# Patient Record
Sex: Female | Born: 1970 | Race: White | Hispanic: Yes | Marital: Married | Smoking: Never smoker
Health system: Southern US, Community
[De-identification: ages and names within clinical notes are randomized; demographics above are authoritative.]

## PROBLEM LIST (undated history)

## (undated) ENCOUNTER — Inpatient Hospital Stay (HOSPITAL_COMMUNITY): Payer: Self-pay

## (undated) DIAGNOSIS — N898 Other specified noninflammatory disorders of vagina: Secondary | ICD-10-CM

## (undated) DIAGNOSIS — J40 Bronchitis, not specified as acute or chronic: Secondary | ICD-10-CM

## (undated) DIAGNOSIS — T7840XA Allergy, unspecified, initial encounter: Secondary | ICD-10-CM

## (undated) DIAGNOSIS — E785 Hyperlipidemia, unspecified: Secondary | ICD-10-CM

## (undated) DIAGNOSIS — L719 Rosacea, unspecified: Secondary | ICD-10-CM

## (undated) DIAGNOSIS — K219 Gastro-esophageal reflux disease without esophagitis: Secondary | ICD-10-CM

## (undated) DIAGNOSIS — J45909 Unspecified asthma, uncomplicated: Secondary | ICD-10-CM

## (undated) DIAGNOSIS — E119 Type 2 diabetes mellitus without complications: Secondary | ICD-10-CM

## (undated) HISTORY — DX: Allergy, unspecified, initial encounter: T78.40XA

## (undated) HISTORY — DX: Hyperlipidemia, unspecified: E78.5

## (undated) HISTORY — PX: DILATION AND CURETTAGE OF UTERUS: SHX78

## (undated) HISTORY — DX: Gastro-esophageal reflux disease without esophagitis: K21.9

## (undated) HISTORY — DX: Type 2 diabetes mellitus without complications: E11.9

---
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
## 1999-10-10 ENCOUNTER — Encounter: Admission: RE | Admit: 1999-10-10 | Discharge: 1999-10-10 | Payer: Self-pay | Admitting: Obstetrics

## 1999-10-17 ENCOUNTER — Encounter: Admission: RE | Admit: 1999-10-17 | Discharge: 1999-10-17 | Payer: Self-pay | Admitting: Obstetrics

## 1999-11-04 ENCOUNTER — Ambulatory Visit (HOSPITAL_COMMUNITY): Admission: RE | Admit: 1999-11-04 | Discharge: 1999-11-04 | Payer: Self-pay | Admitting: Obstetrics & Gynecology

## 1999-11-14 ENCOUNTER — Encounter: Admission: RE | Admit: 1999-11-14 | Discharge: 1999-11-14 | Payer: Self-pay | Admitting: Obstetrics

## 1999-11-27 ENCOUNTER — Encounter: Admission: RE | Admit: 1999-11-27 | Discharge: 1999-11-27 | Payer: Self-pay | Admitting: Obstetrics

## 1999-12-25 ENCOUNTER — Encounter: Admission: RE | Admit: 1999-12-25 | Discharge: 1999-12-25 | Payer: Self-pay | Admitting: Obstetrics & Gynecology

## 2000-01-08 ENCOUNTER — Encounter: Admission: RE | Admit: 2000-01-08 | Discharge: 2000-01-08 | Payer: Self-pay | Admitting: Obstetrics & Gynecology

## 2000-01-22 ENCOUNTER — Encounter: Admission: RE | Admit: 2000-01-22 | Discharge: 2000-01-22 | Payer: Self-pay | Admitting: Obstetrics & Gynecology

## 2000-01-25 ENCOUNTER — Inpatient Hospital Stay (HOSPITAL_COMMUNITY): Admission: AD | Admit: 2000-01-25 | Discharge: 2000-01-28 | Payer: Self-pay | Admitting: *Deleted

## 2000-01-27 ENCOUNTER — Encounter: Payer: Self-pay | Admitting: *Deleted

## 2000-01-29 ENCOUNTER — Encounter: Admission: RE | Admit: 2000-01-29 | Discharge: 2000-01-29 | Payer: Self-pay | Admitting: Obstetrics & Gynecology

## 2000-02-12 ENCOUNTER — Encounter: Admission: RE | Admit: 2000-02-12 | Discharge: 2000-02-12 | Payer: Self-pay | Admitting: Obstetrics & Gynecology

## 2000-02-19 ENCOUNTER — Encounter: Admission: RE | Admit: 2000-02-19 | Discharge: 2000-02-19 | Payer: Self-pay | Admitting: Obstetrics & Gynecology

## 2000-02-26 ENCOUNTER — Encounter: Admission: RE | Admit: 2000-02-26 | Discharge: 2000-02-26 | Payer: Self-pay | Admitting: Obstetrics & Gynecology

## 2000-03-04 ENCOUNTER — Encounter: Admission: RE | Admit: 2000-03-04 | Discharge: 2000-03-04 | Payer: Self-pay | Admitting: Obstetrics & Gynecology

## 2000-03-11 ENCOUNTER — Encounter: Admission: RE | Admit: 2000-03-11 | Discharge: 2000-03-11 | Payer: Self-pay | Admitting: Obstetrics & Gynecology

## 2000-03-17 ENCOUNTER — Inpatient Hospital Stay (HOSPITAL_COMMUNITY): Admission: AD | Admit: 2000-03-17 | Discharge: 2000-03-19 | Payer: Self-pay | Admitting: *Deleted

## 2000-05-01 ENCOUNTER — Inpatient Hospital Stay (HOSPITAL_COMMUNITY): Admission: AD | Admit: 2000-05-01 | Discharge: 2000-05-01 | Payer: Self-pay | Admitting: *Deleted

## 2002-02-21 ENCOUNTER — Ambulatory Visit (HOSPITAL_COMMUNITY): Admission: RE | Admit: 2002-02-21 | Discharge: 2002-02-21 | Payer: Self-pay | Admitting: Obstetrics and Gynecology

## 2002-07-19 ENCOUNTER — Inpatient Hospital Stay (HOSPITAL_COMMUNITY): Admission: AD | Admit: 2002-07-19 | Discharge: 2002-07-22 | Payer: Self-pay | Admitting: Obstetrics and Gynecology

## 2004-05-30 ENCOUNTER — Emergency Department (HOSPITAL_COMMUNITY): Admission: EM | Admit: 2004-05-30 | Discharge: 2004-05-31 | Payer: Self-pay | Admitting: Emergency Medicine

## 2004-06-04 ENCOUNTER — Inpatient Hospital Stay (HOSPITAL_COMMUNITY): Admission: AD | Admit: 2004-06-04 | Discharge: 2004-06-04 | Payer: Self-pay | Admitting: *Deleted

## 2004-06-04 ENCOUNTER — Ambulatory Visit: Payer: Self-pay | Admitting: Obstetrics & Gynecology

## 2004-06-06 ENCOUNTER — Inpatient Hospital Stay (HOSPITAL_COMMUNITY): Admission: AD | Admit: 2004-06-06 | Discharge: 2004-06-06 | Payer: Self-pay | Admitting: Obstetrics & Gynecology

## 2004-06-10 ENCOUNTER — Inpatient Hospital Stay (HOSPITAL_COMMUNITY): Admission: AD | Admit: 2004-06-10 | Discharge: 2004-06-10 | Payer: Self-pay | Admitting: Obstetrics & Gynecology

## 2004-06-13 ENCOUNTER — Ambulatory Visit (HOSPITAL_COMMUNITY): Admission: RE | Admit: 2004-06-13 | Discharge: 2004-06-13 | Payer: Self-pay | Admitting: Obstetrics & Gynecology

## 2004-06-13 ENCOUNTER — Encounter (INDEPENDENT_AMBULATORY_CARE_PROVIDER_SITE_OTHER): Payer: Self-pay | Admitting: *Deleted

## 2004-06-13 ENCOUNTER — Ambulatory Visit: Payer: Self-pay | Admitting: Obstetrics & Gynecology

## 2004-06-20 ENCOUNTER — Ambulatory Visit: Payer: Self-pay | Admitting: Family Medicine

## 2005-01-19 ENCOUNTER — Emergency Department (HOSPITAL_COMMUNITY): Admission: EM | Admit: 2005-01-19 | Discharge: 2005-01-19 | Payer: Self-pay | Admitting: Emergency Medicine

## 2005-01-27 ENCOUNTER — Ambulatory Visit: Payer: Self-pay | Admitting: Nurse Practitioner

## 2005-02-04 ENCOUNTER — Ambulatory Visit: Payer: Self-pay | Admitting: *Deleted

## 2005-02-11 ENCOUNTER — Ambulatory Visit: Payer: Self-pay | Admitting: Nurse Practitioner

## 2005-03-04 ENCOUNTER — Ambulatory Visit: Payer: Self-pay | Admitting: Nurse Practitioner

## 2005-03-12 ENCOUNTER — Ambulatory Visit: Payer: Self-pay | Admitting: Nurse Practitioner

## 2005-03-26 ENCOUNTER — Other Ambulatory Visit: Admission: RE | Admit: 2005-03-26 | Discharge: 2005-03-26 | Payer: Self-pay | Admitting: Family Medicine

## 2005-03-26 ENCOUNTER — Ambulatory Visit: Payer: Self-pay | Admitting: Nurse Practitioner

## 2005-03-26 ENCOUNTER — Encounter (INDEPENDENT_AMBULATORY_CARE_PROVIDER_SITE_OTHER): Payer: Self-pay | Admitting: Specialist

## 2007-05-17 ENCOUNTER — Ambulatory Visit: Payer: Self-pay | Admitting: Internal Medicine

## 2007-05-17 LAB — CONVERTED CEMR LAB
ALT: 21 units/L (ref 0–35)
Albumin: 4.3 g/dL (ref 3.5–5.2)
Basophils Absolute: 0 10*3/uL (ref 0.0–0.1)
CO2: 22 meq/L (ref 19–32)
Calcium: 9.4 mg/dL (ref 8.4–10.5)
Chloride: 104 meq/L (ref 96–112)
Eosinophils Relative: 3 % (ref 0–5)
Glucose, Bld: 110 mg/dL — ABNORMAL HIGH (ref 70–99)
HCT: 41.8 % (ref 36.0–46.0)
Hemoglobin: 14.2 g/dL (ref 12.0–15.0)
Lymphocytes Relative: 32 % (ref 12–46)
Lymphs Abs: 2.2 10*3/uL (ref 0.7–4.0)
Neutro Abs: 4 10*3/uL (ref 1.7–7.7)
Platelets: 259 10*3/uL (ref 150–400)
RDW: 12.8 % (ref 11.5–15.5)
Sodium: 139 meq/L (ref 135–145)
TSH: 2.023 microintl units/mL (ref 0.350–5.50)
Total Protein: 7.6 g/dL (ref 6.0–8.3)
WBC: 7 10*3/uL (ref 4.0–10.5)

## 2007-06-28 ENCOUNTER — Ambulatory Visit: Payer: Self-pay | Admitting: Family Medicine

## 2007-07-05 ENCOUNTER — Ambulatory Visit: Payer: Self-pay | Admitting: Internal Medicine

## 2007-07-20 ENCOUNTER — Ambulatory Visit: Payer: Self-pay | Admitting: Internal Medicine

## 2007-07-26 ENCOUNTER — Ambulatory Visit: Payer: Self-pay | Admitting: Internal Medicine

## 2007-09-21 ENCOUNTER — Ambulatory Visit: Payer: Self-pay | Admitting: Internal Medicine

## 2008-03-07 ENCOUNTER — Ambulatory Visit: Payer: Self-pay | Admitting: Family Medicine

## 2008-03-15 ENCOUNTER — Ambulatory Visit: Payer: Self-pay | Admitting: Internal Medicine

## 2008-03-22 ENCOUNTER — Ambulatory Visit: Payer: Self-pay | Admitting: Internal Medicine

## 2008-03-22 LAB — CONVERTED CEMR LAB
Anti Nuclear Antibody(ANA): NEGATIVE
BUN: 15 mg/dL (ref 6–23)
Basophils Absolute: 0 10*3/uL (ref 0.0–0.1)
Basophils Relative: 0 % (ref 0–1)
Lymphocytes Relative: 45 % (ref 12–46)
MCHC: 33.2 g/dL (ref 30.0–36.0)
Monocytes Absolute: 0.4 10*3/uL (ref 0.1–1.0)
Neutro Abs: 2 10*3/uL (ref 1.7–7.7)
Neutrophils Relative %: 43 % (ref 43–77)
Platelets: 247 10*3/uL (ref 150–400)
Potassium: 4.3 meq/L (ref 3.5–5.3)
RDW: 12.6 % (ref 11.5–15.5)
Sed Rate: 12 mm/hr (ref 0–22)
Sodium: 140 meq/L (ref 135–145)

## 2008-03-31 ENCOUNTER — Ambulatory Visit: Payer: Self-pay | Admitting: Internal Medicine

## 2008-03-31 ENCOUNTER — Encounter: Payer: Self-pay | Admitting: Family Medicine

## 2008-03-31 LAB — CONVERTED CEMR LAB
Chlamydia, DNA Probe: NEGATIVE
GC Probe Amp, Genital: NEGATIVE

## 2008-04-01 ENCOUNTER — Encounter: Payer: Self-pay | Admitting: Family Medicine

## 2008-04-10 ENCOUNTER — Ambulatory Visit: Payer: Self-pay | Admitting: Internal Medicine

## 2008-06-26 ENCOUNTER — Ambulatory Visit: Payer: Self-pay | Admitting: Internal Medicine

## 2008-07-03 ENCOUNTER — Ambulatory Visit: Payer: Self-pay | Admitting: Internal Medicine

## 2008-07-11 ENCOUNTER — Encounter: Payer: Self-pay | Admitting: Family Medicine

## 2008-07-11 ENCOUNTER — Ambulatory Visit: Payer: Self-pay | Admitting: Internal Medicine

## 2008-07-11 LAB — CONVERTED CEMR LAB
ALT: 14 units/L (ref 0–35)
AST: 17 units/L (ref 0–37)
Albumin: 4.1 g/dL (ref 3.5–5.2)
Alkaline Phosphatase: 61 units/L (ref 39–117)
BUN: 15 mg/dL (ref 6–23)
Creatinine, Ser: 0.67 mg/dL (ref 0.40–1.20)
Potassium: 4 meq/L (ref 3.5–5.3)

## 2008-07-17 ENCOUNTER — Ambulatory Visit: Payer: Self-pay | Admitting: Internal Medicine

## 2008-08-28 ENCOUNTER — Ambulatory Visit: Payer: Self-pay | Admitting: Internal Medicine

## 2009-09-11 ENCOUNTER — Ambulatory Visit: Payer: Self-pay | Admitting: Internal Medicine

## 2009-12-05 ENCOUNTER — Ambulatory Visit: Payer: Self-pay | Admitting: Internal Medicine

## 2009-12-20 ENCOUNTER — Ambulatory Visit: Payer: Self-pay | Admitting: Internal Medicine

## 2009-12-21 ENCOUNTER — Encounter (INDEPENDENT_AMBULATORY_CARE_PROVIDER_SITE_OTHER): Payer: Self-pay | Admitting: Internal Medicine

## 2010-10-04 NOTE — Op Note (Signed)
NAME:  Tiffany Vazquez, Tiffany Vazquez                         ACCOUNT NO.:  [id number]   MEDICAL RECORD NO.:  [id number]                   PATIENT TYPE:  INP   LOCATION:  9199                                 FACILITY:  [facility]   PHYSICIAN:  Mary Sella. Orlene Erm, M.D.                 DATE OF BIRTH:  [birth year]   DATE OF PROCEDURE:  07/19/2002  DATE OF DISCHARGE:                                 OPERATIVE REPORT   PREOPERATIVE DIAGNOSES:  A 40 year old gravida 3, para 2 at term with a  breech presentation in active labor.   POSTOPERATIVE DIAGNOSES:  A 40 year old gravida 3, para 2 at term with a  breech presentation in active labor.   PROCEDURE:  Repeat low transverse cesarean section.   SURGEON:  Mary Sella. Orlene Erm, M.D.   ASSISTANT:  Caren Griffins, C.N.M.   ANESTHESIA:  Spinal.   COMPLICATIONS:  None.   SPECIMENS:  None.   ESTIMATED BLOOD LOSS:  1000 mL.   FINDINGS:  Viable female infant delivered at term from breech presentation.  Dense omental adhesions to the anterior uterus.   DISPOSITION:  Recovery room, stable.   INDICATIONS FOR PROCEDURE:  The patient is a 40 year old gravida 3, para 2  who had a previous cesarean section for breech presentation.  Presented to  [facility] Clinic in active labor dilated 4 cm.  She was admitted from maternity  admissions and found to be 4-5 cm.  The patient was counseled on the risk of  cesarean section including risk of bleeding, infection, injury to internal  organs, risk of transfusion, or emergent hysterectomy.  The patient was  counseled on these via interpreter and acknowledges understanding.   PROCEDURE:  The patient was taken to operating room.  She was given spinal  anesthesia.  She was prepped and draped in a sterile fashion.  A  Pfannenstiel incision was performed and carried down to the underlying  fascia with a scalpel.  The fascia was entered sharply and dissected  laterally with Mayo scissors.  The fascia was separated from the underlying  rectus  muscle bellies with sharp and blunt dissection.  The peritoneum was  identified and entered sharply.  The peritoneum was dissected both  superiorly and inferiorly.  A bladder blade was placed.  There was noted to  be dense omental adhesions over the anterior surface of the uterus.  These  were dissected with the Bovie in sharp dissection to get this out of the  operative field.  The bladder flap was created with sharp and blunt  dissection.  The uterus was scored with a scalpel and the incision was  carried down to the uterine cavity.  The amniotic membranes were ruptured  and the fluid was clear.  The infant's feet were grasped and delivered  through the uterine incision.  The infant's body was delivered and the  infant's right arm was delivered.  The infant was rotated  180 degrees and  the infant's left arm was delivered.  The infant's head was flexed and  delivered easily through the uterine incision.  The cord was clamped and cut  and the infant was handed off to the awaiting neonatal resuscitation team.  The uterus was massaged and the placenta was extracted.  The uterus was  curetted with a dry lap sponge.  The uterus was externalized and the  incision was closed in a running locking stitch of 0 chromic.  A second  imbricating suture was performed.  Several figure-of-eight sutures were used  to obtain final hemostasis.  There was multiple sites of oozing from the  omentum and these were cauterized.  There was one large vessel which had  been previously clamped and was tied with 2-0 Vicryl.  These were all  hemostatic.  The uterus was visualized again after it had been returned to  the abdominal cavity and noted to be hemostatic.  The omental dissection was  visualized as well and noted to be hemostatic.  The fascia was then closed  with a running stitch of 0 Vicryl.  The skin and subcutaneous tissues were  irrigated with copious amounts of saline and the skin edges were   reapproximated with staples.  The patient tolerated the procedure well and  returned to the recovery room in stable condition.  Sponge, instrument, and  needle counts were correct at the end of procedure.                                               Mary Sella. Orlene Erm, M.D.    EMH/MEDQ  D:  07/19/2002  T:  07/19/2002  Job:  [id number]

## 2010-10-04 NOTE — Discharge Summary (Signed)
   NAME:  Tiffany Vazquez, Tiffany Vazquez                         ACCOUNT NO.:  [id number]   MEDICAL RECORD NO.:  [id number]                   PATIENT TYPE:  INP   LOCATION:  9131                                 [facility]ILITY:  [facility]   PHYSICIAN:  Conni Elliot, M.D.             DATE OF BIRTH:  [birth year]   DATE OF ADMISSION:  07/19/2002  DATE OF DISCHARGE:  07/22/2002                                 DISCHARGE SUMMARY   HISTORY OF PRESENT ILLNESS:  The patient is a 40 year old gravida 3, para 2  at term with breech presentation who was admitted for repeat low transverse  cesarean delivery.   HOSPITAL COURSE:  The patient had a cesarean delivery on 07/19/02 without  complication.  The patient regained normal bowel and bladder function and  was ready for discharge on 07/22/02.  The patient was instructed to return in  six weeks.   DISCHARGE MEDICATIONS:  Patient was discharged on Micronor and ferrous  sulfate.                                               Conni Elliot, M.D.    ASG/MEDQ  D:  08/30/2002  T:  08/30/2002  Job:  [id number]

## 2010-10-04 NOTE — Group Therapy Note (Signed)
NAME:  Tiffany Vazquez, Tiffany Vazquez               ACCOUNT NO.:  [id number]   MEDICAL RECORD NO.:  [id number]          PATIENT TYPE:  WOC   LOCATION:  [facility]                   FACILITY:  [facility]   PHYSICIAN:  Tinnie Gens, MD        DATE OF BIRTH:  Sep 08, 1970   DATE OF SERVICE:  06/20/2004                                    CLINIC NOTE   CHIEF COMPLAINT:  Follow-up miscarriage.   HISTORY OF PRESENT ILLNESS:  The patient is a 40 year old gravida 4 para 3-0-  1-3 who is status post D&C on June 13, 2004 for a missed AB.  She states  that she has continued to have a little bit of bleeding and some moderate  lower abdominal pain that comes and goes but is daily.  She states it is  worse on the right than the left.  She denies nausea, vomiting, fevers, or  chills.  She states that she has been taking some pain medicine she was  given in the hospital; however, that makes her so sleepy and she is wanting  to go back to work.  The patient continues to have a small amount of  brownish discharge.   PHYSICAL EXAMINATION:  VITAL SIGNS:  As noted in the chart; she is afebrile.  GENERAL:  She is a well-developed, well-nourished Hispanic female.  GENITOURINARY:  She has normal external female genitalia.  The vagina is  pink and rugated.  There is a small amount of pink-tinged discharge.  The  cervix is without lesion.  The uterus is approximately 8 weeks size.  The  right and left adnexa were without mass.  The right adnexa was tender.   IMPRESSION:  1.  Postoperative visit status post D&C for missed abortion.  2.  Lower abdominal pain, questionable etiology.  The patient did have a      normal-appearing ultrasound when she was diagnosed with her AB, although      the right ovary was not well visualized; the left ovary was normal.   PLAN:  1.  I have advised ibuprofen use for recurrent pain.  The patient is to      return with fevers, chills, nausea, or vomiting.  2.  I discussed with this patient the need  to not get pregnant for the next      3 months.  3.  The patient will follow up at [facility] for contraception as well      as Pap smear.      TP/MEDQ  D:  06/20/2004  T:  06/20/2004  Job:  [id number]

## 2010-10-04 NOTE — Op Note (Signed)
NAME:  Tiffany Vazquez, Tiffany Vazquez               ACCOUNT NO.:  [id number]   MEDICAL RECORD NO.:  [id number]          PATIENT TYPE:  AMB   LOCATION:  SDC                           [facility]   PHYSICIAN:  Lesly Dukes, M.D. DATE OF BIRTH:  [birth year]   DATE OF PROCEDURE:  06/13/2004  DATE OF DISCHARGE:                                 OPERATIVE REPORT   PREOPERATIVE DIAGNOSIS:  A 40 year old para 2-0-1-2 female with missed  abortion at approximately 8 weeks estimated gestational age.   POSTOPERATIVE DIAGNOSIS:  A 40 year old para 2-0-1-2 female with missed  abortion at approximately 8 weeks estimated gestational age.   PROCEDURE:  Dilatation, vacuum curettage.   SURGEON:  Dr. Elsie Lincoln   ASSISTANT:  None.   ANESTHESIA:  MAC and local.   SPECIMENS:  POC.   ESTIMATED BLOOD LOSS:  Minimal.   COMPLICATIONS:  None.   DESCRIPTION OF PROCEDURE:  After informed consent was obtained, the patient  was taken to the operating room where IV was running.  MAC anesthesia was  administered.  The patient was placed in dorsal lithotomy position and  prepared in normal sterile fashion.  The bladder was in-and-out  catheterized.  A bivalve speculum was placed into the patient's vagina and  the cervix brought into view.  The anterior lip of the cervix was grasped  with a single-tooth tenaculum.  Local anesthesia was then administered with  1% lidocaine; 10 mL were injected at 3 o'clock, and another 10 mL were  injected at 9 o'clock on the cervix.  The cervical os was gently dilated to  a #9, #9 Hegar dilator.  A #8 curved suction curette was introduced into the  uterus, and gentle suction curettage was performed.  The suction curette was  removed, and a sharp curette was introduced into the uterus, and gentle  sharp curettage was performed.  A good cry was felt on all 4 sides.  One  last pass was done with the suction curette to ensure that all POC was  removed.  All instruments were removed from  the patient's vagina.  There was  no bleeding from the cervical os or the tenaculum sites.  The patient  tolerated the procedure well.  Sponge, lap, instrument count were correct x  2, and the patient went to the recovery room in stable condition.     KHL/MEDQ  D:  06/13/2004  T:  06/13/2004  Job:  [id number]

## 2010-10-04 NOTE — Group Therapy Note (Signed)
NAME:  Tiffany Vazquez, Tiffany Vazquez               ACCOUNT NO.:  [id number]   MEDICAL RECORD NO.:  [id number]          PATIENT TYPE:  WOC   LOCATION:  [facility]                   FACILITY:  [facility]   PHYSICIAN:  Elsie Lincoln, MD      DATE OF BIRTH:  [birth year]   DATE OF SERVICE:  06/04/2004                                    CLINIC NOTE   This is a 40 year old G4, P3-0-0-3 who states that her last period was the  end of October and that she had positive subjective symptoms of pregnancy,  but no positive pregnancy test prior to presenting at [facility] on May 30, 2004.  She had presented with heavy vaginal bleeding and  clots which had begun approximately two days before presenting.  She did  have an ultrasound which was consistent with an embryo of 9 weeks and 0 days  with no cardiac activity and subchorionic hemorrhage present.  Her  quantitative on that day was 5273.  Her blood type is A+.  Her CBC was done.  Result is not available at this moment.  She was documented as being told to  go to [place] on January 13.  However, the patient states that her  appointment was here today and that she has never been to [place].  There was a misunderstanding.  Since she left the ER she has continued to  have some cramping with some menstrual-like bleeding, mostly at night which  had been on and off heavy.  However, today it is more like coffee-colored  and small amount.  She thinks she may have passed some tissue.  She also  complains of being lightheaded, but is not experiencing presyncopal symptoms  at this time.  Also, she is interested in contraception and would like to be  started on oral contraceptives.   ASSESSMENT:  Missed abortion about 9 weeks size.   PLAN:  In consultation with Dr. Penne Lash the patient is sent to maternity  admissions for a follow-up CBC with differential and ultrasound.  Based on  these results she may be offered Cytotec protocol and it will be  reviewed  with the interpreter.      DP/MEDQ  D:  06/04/2004  T:  06/04/2004  Job:  [id number]

## 2010-12-25 ENCOUNTER — Inpatient Hospital Stay (INDEPENDENT_AMBULATORY_CARE_PROVIDER_SITE_OTHER)
Admission: RE | Admit: 2010-12-25 | Discharge: 2010-12-25 | Disposition: A | Discharge status: Home / Self Care | Payer: Self-pay | Source: Ambulatory Visit | Attending: Family Medicine | Admitting: Family Medicine

## 2010-12-25 DIAGNOSIS — N390 Urinary tract infection, site not specified: Secondary | ICD-10-CM

## 2010-12-25 LAB — POCT URINALYSIS DIP (DEVICE)
Nitrite: NEGATIVE
Protein, ur: NEGATIVE mg/dL
Urobilinogen, UA: 0.2 mg/dL (ref 0.0–1.0)
pH: 5.5 (ref 5.0–8.0)

## 2010-12-26 LAB — URINE CULTURE: Culture: NO GROWTH

## 2011-02-25 ENCOUNTER — Inpatient Hospital Stay (INDEPENDENT_AMBULATORY_CARE_PROVIDER_SITE_OTHER)
Admission: RE | Admit: 2011-02-25 | Discharge: 2011-02-25 | Disposition: A | Discharge status: Home / Self Care | Payer: Self-pay | Source: Ambulatory Visit | Attending: Emergency Medicine | Admitting: Emergency Medicine

## 2011-02-25 DIAGNOSIS — L255 Unspecified contact dermatitis due to plants, except food: Secondary | ICD-10-CM

## 2011-09-27 ENCOUNTER — Encounter (HOSPITAL_COMMUNITY): Payer: Self-pay | Admitting: *Deleted

## 2011-09-27 ENCOUNTER — Emergency Department (HOSPITAL_COMMUNITY)
Admission: EM | Admit: 2011-09-27 | Discharge: 2011-09-27 | Disposition: A | Discharge status: Home / Self Care | Payer: Self-pay | Source: Home / Self Care | Attending: Family Medicine | Admitting: Family Medicine

## 2011-09-27 DIAGNOSIS — J302 Other seasonal allergic rhinitis: Secondary | ICD-10-CM

## 2011-09-27 DIAGNOSIS — J309 Allergic rhinitis, unspecified: Secondary | ICD-10-CM

## 2011-09-27 DIAGNOSIS — R05 Cough: Secondary | ICD-10-CM

## 2011-09-27 HISTORY — DX: Bronchitis, not specified as acute or chronic: J40

## 2011-09-27 MED ORDER — FLUTICASONE PROPIONATE 50 MCG/ACT NA SUSP: 1.0000 | Freq: Two times a day (BID) | NASAL | Status: DC

## 2011-09-27 MED ORDER — METHYLPREDNISOLONE ACETATE 40 MG/ML IJ SUSP: 80.0000 mg | Freq: Once | INTRAMUSCULAR | Status: DC

## 2011-09-27 MED ORDER — CETIRIZINE HCL 10 MG PO TABS: 10.0000 mg | ORAL_TABLET | Freq: Every day | ORAL | Status: DC

## 2011-09-27 MED ORDER — METHYLPREDNISOLONE ACETATE 80 MG/ML IJ SUSP
INTRAMUSCULAR | Status: AC
  Filled 2011-09-27: qty 1

## 2011-09-27 NOTE — ED Notes (Signed)
C/O congestion and cough since 5/3.  Has been using OTC allergy tabs, Mucinex, and albuterol HFA prn.

## 2011-09-27 NOTE — ED Provider Notes (Signed)
History     CSN: [id number]  Arrival date & time 09/27/11  7829   First MD Initiated Contact with Patient 09/27/11 1008      No chief complaint on file.   (Consider location/radiation/quality/duration/timing/severity/associated sxs/prior treatment) Patient is a 41 y.o. female presenting with cough. The history is provided by the patient.  Cough This is a new problem. The current episode started more than 1 week ago. The problem has not changed since onset.The cough is non-productive. There has been no fever. Associated symptoms include rhinorrhea. Pertinent negatives include no wheezing. She has tried decongestants for the symptoms. The treatment provided no relief. She is not a smoker.    No past medical history on file.  No past surgical history on file.  No family history on file.  History  Substance Use Topics  . Smoking status: Not on file  . Smokeless tobacco: Not on file  . Alcohol Use: Not on file    OB History    No data available      Review of Systems  Constitutional: Negative.   HENT: Positive for congestion, rhinorrhea and postnasal drip.   Respiratory: Positive for cough. Negative for wheezing.   Gastrointestinal: Negative.   Skin: Negative.     Allergies  Review of patient's allergies indicates not on file.  Home Medications  No current outpatient prescriptions on file.  BP 134/82  Pulse 77  Temp(Src) 98.4 F (36.9 C) (Oral)  Resp 18  SpO2 96%  Physical Exam  Nursing note and vitals reviewed. Constitutional: She is oriented to person, place, and time. She appears well-developed and well-nourished.  HENT:  Head: Normocephalic.  Right Ear: External ear normal.  Left Ear: External ear normal.  Nose: Mucosal edema and rhinorrhea present.  Mouth/Throat: Oropharynx is clear and moist.  Eyes: Conjunctivae are normal. Pupils are equal, round, and reactive to light.  Neck: Normal range of motion. Neck supple.  Cardiovascular: Normal rate,  regular rhythm, normal heart sounds and intact distal pulses.   Pulmonary/Chest: Breath sounds normal.  Lymphadenopathy:    She has no cervical adenopathy.  Neurological: She is alert and oriented to person, place, and time.  Skin: Skin is warm and dry.  Psychiatric: She has a normal mood and affect.    ED Course  Procedures (including critical care time)  Labs Reviewed - No data to display No results found.   No diagnosis found.    MDM         Linna Hoff, MD 09/27/11 1124

## 2012-06-11 ENCOUNTER — Encounter (HOSPITAL_COMMUNITY): Payer: Self-pay

## 2012-06-11 ENCOUNTER — Emergency Department (HOSPITAL_COMMUNITY)
Admission: EM | Admit: 2012-06-11 | Discharge: 2012-06-11 | Disposition: A | Discharge status: Home / Self Care | Payer: No Typology Code available for payment source | Source: Home / Self Care | Attending: Family Medicine | Admitting: Family Medicine

## 2012-06-11 DIAGNOSIS — L719 Rosacea, unspecified: Secondary | ICD-10-CM

## 2012-06-11 MED ORDER — TACROLIMUS 0.1 % EX OINT: TOPICAL_OINTMENT | Freq: Two times a day (BID) | CUTANEOUS | Status: DC

## 2012-06-11 MED ORDER — DESONIDE 0.05 % EX CREA: TOPICAL_CREAM | Freq: Two times a day (BID) | CUTANEOUS | Status: DC

## 2012-06-11 MED ORDER — METRONIDAZOLE 0.75 % EX CREA: TOPICAL_CREAM | CUTANEOUS | Status: DC

## 2012-06-11 NOTE — ED Notes (Signed)
Patient states has rosacea- needs medication refills

## 2012-06-11 NOTE — ED Provider Notes (Signed)
History    CSN: [id number]  Arrival date & time 06/11/12  1535  First MD Initiated Contact with Patient 06/11/12 1607     Chief Complaint  Patient presents with  . Medication Refill   HPI Pt has a history of rosacea and needs to have her medications refilled.  She says that she uses them and they seem to work very well to keep her symptoms controlled.  She did not bring any of her medical records today.    Past Medical History  Diagnosis Date  . Bronchitis     History reviewed. No pertinent past surgical history.  No family history on file.  History  Substance Use Topics  . Smoking status: Never Smoker   . Smokeless tobacco: Not on file  . Alcohol Use: No    OB History    Grav Para Term Preterm Abortions TAB SAB Ect Mult Living                 Review of Systems  Skin: Positive for color change, pallor and rash.  All other systems reviewed and are negative.   Allergies  Review of patient's allergies indicates no known allergies.  Home Medications   Current Outpatient Rx  Name  Route  Sig  Dispense  Refill  . DESONIDE 0.05 % EX CREA   Topical   Apply topically 2 (two) times daily.         Marland Kitchen METRONIDAZOLE 0.75 % EX CREA   Topical   Apply topically as directed.         Marland Kitchen TACROLIMUS 0.1 % EX OINT   Topical   Apply topically 2 (two) times daily.         . VENTOLIN IN   Inhalation   Inhale into the lungs.         . CETIRIZINE HCL 10 MG PO TABS   Oral   Take 1 tablet (10 mg total) by mouth daily. One tab daily for allergies   30 tablet   1   . FLUTICASONE PROPIONATE 50 MCG/ACT NA SUSP   Nasal   Place 1 spray into the nose 2 (two) times daily.   1 g   2    BP 111/41  Pulse 57  Temp 98.1 F (36.7 C) (Oral)  Resp 18  SpO2 99%  LMP 05/22/2012  Physical Exam  Nursing note and vitals reviewed. Constitutional: She is oriented to person, place, and time. She appears well-developed and well-nourished. No distress.  HENT:  Head:  Normocephalic and atraumatic.  Eyes: EOM are normal. Pupils are equal, round, and reactive to light.  Neck: Normal range of motion. Neck supple.  Cardiovascular: Normal rate and regular rhythm.   Pulmonary/Chest: Effort normal.  Abdominal: Soft.  Musculoskeletal: Normal range of motion.  Neurological: She is alert and oriented to person, place, and time.  Skin: Skin is warm and dry.  Psychiatric: She has a normal mood and affect. Her behavior is normal. Judgment and thought content normal.    ED Course  Procedures (including critical care time)  Labs Reviewed - No data to display No results found.  No diagnosis found.  MDM  IMPRESSION  Rosacea, controlled with meds  RECOMMENDATIONS / PLAN  Refilled meds for patient today   FOLLOW UP 2 months   The patient was given clear instructions to go to ER or return to medical center if symptoms don't improve, worsen or new problems develop.  The patient verbalized understanding.  The patient  was told to call to get lab results if they haven't heard anything in the next week.            Cleora Fleet, MD 06/11/12 1715

## 2012-06-15 NOTE — ED Notes (Signed)
Referral faxed to [facility]-waiting for appt

## 2012-09-28 ENCOUNTER — Emergency Department (HOSPITAL_COMMUNITY)
Admission: EM | Admit: 2012-09-28 | Discharge: 2012-09-28 | Disposition: A | Discharge status: Home / Self Care | Payer: No Typology Code available for payment source | Source: Home / Self Care | Attending: Family Medicine | Admitting: Family Medicine

## 2012-09-28 ENCOUNTER — Encounter (HOSPITAL_COMMUNITY): Payer: Self-pay | Admitting: *Deleted

## 2012-09-28 DIAGNOSIS — J302 Other seasonal allergic rhinitis: Secondary | ICD-10-CM

## 2012-09-28 DIAGNOSIS — J309 Allergic rhinitis, unspecified: Secondary | ICD-10-CM

## 2012-09-28 MED ORDER — FLUTICASONE PROPIONATE 50 MCG/ACT NA SUSP: 1.0000 | Freq: Two times a day (BID) | NASAL | Status: DC

## 2012-09-28 MED ORDER — HYDROCOD POLST-CHLORPHEN POLST 10-8 MG/5ML PO LQCR: 5.0000 mL | Freq: Two times a day (BID) | ORAL | Status: DC | PRN

## 2012-09-28 NOTE — ED Notes (Signed)
Onset 3 days ago sore throat, then the next day productive cough.  Today she thinks she has had a slight fever   She has tried OTC cold meds with some relief.

## 2012-09-28 NOTE — ED Provider Notes (Signed)
History     CSN: [id number]  Arrival date & time 09/28/12  1001   First MD Initiated Contact with Patient 09/28/12 1016      Chief Complaint  Patient presents with  . Cough    (Consider location/radiation/quality/duration/timing/severity/associated sxs/prior treatment) Patient is a 42 y.o. female presenting with cough. The history is provided by the patient.  Cough Cough characteristics:  Non-productive Severity:  Mild Duration:  3 days Timing:  Intermittent Progression:  Unchanged Chronicity:  New Smoker: no   Context: weather changes   Associated symptoms: rhinorrhea and sore throat   Associated symptoms: no wheezing     Past Medical History  Diagnosis Date  . Bronchitis   . Bronchitis     History reviewed. No pertinent past surgical history.  No family history on file.  History  Substance Use Topics  . Smoking status: Never Smoker   . Smokeless tobacco: Not on file  . Alcohol Use: No    OB History   Grav Para Term Preterm Abortions TAB SAB Ect Mult Living                  Review of Systems  HENT: Positive for congestion, sore throat and rhinorrhea.   Respiratory: Positive for cough. Negative for wheezing.     Allergies  Review of patient's allergies indicates no known allergies.  Home Medications   Current Outpatient Rx  Name  Route  Sig  Dispense  Refill  . Albuterol (VENTOLIN IN)   Inhalation   Inhale into the lungs.         Marland Kitchen desonide (DESOWEN) 0.05 % cream   Topical   Apply topically 2 (two) times daily.   30 g   3   . metroNIDAZOLE (METROCREAM) 0.75 % cream   Topical   Apply topically as directed.   45 g   3   . tacrolimus (PROTOPIC) 0.1 % ointment   Topical   Apply topically 2 (two) times daily.   100 g   3   . chlorpheniramine-HYDROcodone (TUSSIONEX PENNKINETIC ER) 10-8 MG/5ML LQCR   Oral   Take 5 mLs by mouth every 12 (twelve) hours as needed.   115 mL   0   . fluticasone (FLONASE) 50 MCG/ACT nasal spray    Nasal   Place 1 spray into the nose 2 (two) times daily.   1 g   2     BP 120/53  Pulse 83  Temp(Src) 98.1 F (36.7 C) (Oral)  Resp 18  SpO2 98%  LMP 09/14/2012  Physical Exam  Nursing note and vitals reviewed. Constitutional: She is oriented to person, place, and time. She appears well-developed and well-nourished.  HENT:  Head: Normocephalic.  Right Ear: External ear normal.  Left Ear: External ear normal.  Mouth/Throat: Oropharynx is clear and moist.  Eyes: Conjunctivae are normal. Pupils are equal, round, and reactive to light.  Neck: Normal range of motion. Neck supple.  Cardiovascular: Regular rhythm.   Pulmonary/Chest: Breath sounds normal. She has no wheezes.  Lymphadenopathy:    She has no cervical adenopathy.  Neurological: She is alert and oriented to person, place, and time.  Skin: Skin is warm and dry.    ED Course  Procedures (including critical care time)  Labs Reviewed - No data to display No results found.   1. Seasonal allergic rhinitis       MDM          Linna Hoff, MD 09/28/12 1037

## 2012-10-06 ENCOUNTER — Telehealth: Payer: Self-pay | Admitting: Family Medicine

## 2012-10-08 ENCOUNTER — Ambulatory Visit: Payer: No Typology Code available for payment source | Attending: Family Medicine | Admitting: Family Medicine

## 2012-10-08 VITALS — BP 121/66 | HR 96 | Temp 98.8°F | Resp 19 | Wt 167.0 lb

## 2012-10-08 DIAGNOSIS — R05 Cough: Secondary | ICD-10-CM

## 2012-10-08 DIAGNOSIS — R062 Wheezing: Secondary | ICD-10-CM | POA: Insufficient documentation

## 2012-10-08 DIAGNOSIS — R059 Cough, unspecified: Secondary | ICD-10-CM | POA: Insufficient documentation

## 2012-10-08 MED ORDER — PREDNISONE 10 MG PO TABS: ORAL_TABLET | ORAL | Status: DC

## 2012-10-08 MED ORDER — IPRATROPIUM-ALBUTEROL 0.5-2.5 (3) MG/3ML IN SOLN
3.0000 mL | Freq: Once | RESPIRATORY_TRACT | Status: AC
Start: 2012-10-08 — End: 2012-10-08
  Administered 2012-10-08: 3 mL via RESPIRATORY_TRACT

## 2012-10-08 MED ORDER — PREDNISONE 20 MG PO TABS
60.0000 mg | ORAL_TABLET | Freq: Once | ORAL | Status: AC
  Administered 2012-10-08: 60 mg via ORAL

## 2012-10-08 MED ORDER — ALBUTEROL SULFATE HFA 108 (90 BASE) MCG/ACT IN AERS: 2.0000 | INHALATION_SPRAY | Freq: Four times a day (QID) | RESPIRATORY_TRACT | Status: DC | PRN

## 2012-10-08 NOTE — Progress Notes (Signed)
Subjective:     Patient ID: Tiffany Vazquez, female   DOB: 02-21-1971, 41 y.o.   MRN: [id number]  HPI Pt here with cough that has been worsening for a month. Sometimes associated with tightness with deep breath, wheezing which is worse at night. Cough is all day and all night. Was seen in the ED 10 days ago and the thought at that time was that her seasonal allergies were flaring. Since then, however, the cough has worsened as well as the wheezing despite taking zyrtec. She had been given prescriptions for tussionex and flonase but was unable to afford either. Has used her daughter's albuterol inhaler a few times and found it helpful.   Review of Systemsno fevers, chest pain, or uri sx     Objective:   Physical Exam  Nursing note and vitals reviewed. Constitutional: She appears well-developed and well-nourished.  Cardiovascular: Normal rate, regular rhythm and normal heart sounds.   Pulmonary/Chest: Effort normal.  Wheezing and poor  Air movement initially, both resolved after duoneb  Skin: Skin is warm and dry.  Psychiatric: She has a normal mood and affect.       Assessment:     Cough - Plan: ipratropium-albuterol (DUONEB) 0.5-2.5 (3) MG/3ML nebulizer solution 3 mL, predniSONE (DELTASONE) tablet 60 mg, albuterol (PROVENTIL HFA;VENTOLIN HFA) 108 (90 BASE) MCG/ACT inhaler, predniSONE (DELTASONE) 10 MG tablet       Plan:     Cough - meds as ordered, will see her back next week to se ehow she is doing. If sx return after pred taper may need to consider inhaled steroid during allergy season.   rtc earlier if needed, call with questions. ED if acutely worse.

## 2012-10-08 NOTE — Progress Notes (Signed)
Patient has cough for 2 weeks Congestion with some wheezing Was told it was allergies but has gotten worse

## 2012-10-08 NOTE — Patient Instructions (Signed)
Albuterol inhalation aerosol Qu es este medicamento? El ALBUTEROL es un broncodilatador. Ayuda a abrir las vas areas a los pulmones y Engineer, manufacturing systems respirar ms fcilmente. Este medicamento es utilizado para tratar y Freight forwarder broncoespasmo. Este medicamento puede ser utilizado para otros usos; si tiene alguna pregunta consulte con su proveedor de atencin mdica o con su farmacutico. Qu le debo informar a mi profesional de la salud antes de tomar este medicamento? Necesita saber si usted presenta alguno de los siguientes problemas o situaciones: -diabetes -enfermedad cardiaca o pulso cardiaco irregular -alta presin sangunea -feocromocitoma -convulsiones -enfermedad tiroidea -una reaccin alrgica o inusual al albuterol, al levalbuterol, a los sulfitos, a otros medicamentos, alimentos, colorantes o conservadores -si est embarazada o buscando quedar embarazada -si est amamantando a un beb Cmo debo utilizar este medicamento? Este medicamento es para inhalacin por va oral. Siga las instrucciones de la etiqueta del [place]. Utilcelo a intervalos regulares. No utilice su medicamento con una frecuencia mayor a la indicada. Asegrese de que est utilizando su Water engineer. Si tiene [employer], comunquese con su mdico o su proveedor de Psychologist, prison and probation services. Utilice este medicamento antes de usar cualquier Biomedical engineer. Deje pasar 5 minutos o ms antes de Architectural technologist. Hable con su pediatra para informarse acerca del uso de este medicamento en nios. Puede requerir atencin especial. Sobredosis: Pngase en contacto inmediatamente con un centro toxicolgico o una sala de urgencia si usted cree que haya tomado demasiado medicamento. ATENCIN: [employer] es solo para usted. No comparta este medicamento con nadie. Qu sucede si me olvido de una dosis? Si olvida una dosis, sela lo antes posible. Si es casi la hora de su dosis siguiente, aplique slo esa  dosis. No use dosis dobles o adicionales. Qu puede interactuar con este medicamento? -antiinfecciosos como cloroquina y pentamidina -cafena -cisapride -diurticos -medicamentos para resfros -medicamentos para tratar la depresin o trastornos psicticos o emocionales -medicamentos para bajar de peso incluyendo algunos productos a base de hierbas -metadona -ciertos antibiticos Risk analyst, eritromicina, levofloxacino y linezolid -ciertos medicamentos cardiacos -hormonas esteroideas, tales como dexametasona, cortisona, hidrocortisona -teofilina -hormonas tiroideas Puede ser que esta lista no menciona todas las posibles interacciones. Informe a su profesional de [employer] de [employer] productos a base de hierbas, medicamentos de [place] o suplementos nutritivos que est tomando. Si usted fuma, consume bebidas alcohlicas o si utiliza drogas ilegales, indqueselo tambin a su profesional de [employer]. Algunas sustancias pueden interactuar con su medicamento. A qu debo estar atento al usar [employer]? Informe a su mdico o a su profesional de la salud si sus sntomas no mejoran. No utilice albuterol adicional. Si su asma o bronquitis empeora mientras est recibiendo [employer], comunquese inmediatamente con su mdico. Si el medicamento le seca la boca trate de Product manager chicle sin azcar o chupar caramelos duros. Bebe agua como le haya indicado. Qu efectos secundarios puedo tener al [employer] este medicamento? Efectos secundarios que debe informar a su mdico o a Producer, television/film/video de la salud tan pronto como sea posible: -Therapist, art como erupcin cutnea, picazn o urticarias, hinchazn de la cara, labios o lengua -problemas respiratorios -dolor en el pecho -sensacin de desmayos o mareos, cadas -alta presin sangunea -pulso cardiaco irregular -fiebre -calambres o debilidad muscular -dolor, hormigueo, entumecimiento de las manos o  pies -vmito Efectos secundarios que, por lo general, no requieren atencin mdica (debe informarlos a su mdico o a su profesional de la salud si persisten o si son molestos): -tos -dificultad para  conciliar el sueo -dolor de cabeza -nerviosismo, temblores -Programme researcher, broadcasting/film/video -congestin nasal o goteo de la Clinical cytogeneticist -irrtacin de garganta -sabor inusual Puede ser que esta lista no menciona todos los posibles efectos secundarios. Comunquese a su mdico por asesoramiento mdico [employer]. Usted puede informar los efectos secundarios a la FDA por telfono al 1-800-FDA-1088. Dnde debo guardar mi medicina? Mantngala fuera del alcance de los nios. Guarde a [employer] 15 y 30 grados C (32 y 63 grados F). El contenido se encuentra bajo presin y puede explotar si lo expone al calor o al fuego. No lo congele. El fro [employer] efectividad del [place]. Deseche todo el medicamento que no haya utilizado, despus de la fecha de vencimiento. Es posible que deba desechar algunos inhaladores antes de la fecha de vencimiento despus de abrir. Verificar las instrucciones que vienen acompaados con su medicamento para saber el tiempo que puede usar el inhalador despus de abrir. ATENCIN: Este folleto es un resumen. Puede ser que no cubra toda la posible informacin. Si usted tiene preguntas acerca de esta medicina, consulte con su mdico, su farmacutico o su profesional de Radiographer, therapeutic.  2012, Elsevier/Gold Standard. (05/25/2008 2:44:10 PM)

## 2012-10-15 ENCOUNTER — Ambulatory Visit (HOSPITAL_BASED_OUTPATIENT_CLINIC_OR_DEPARTMENT_OTHER): Payer: No Typology Code available for payment source

## 2012-10-25 ENCOUNTER — Ambulatory Visit: Payer: No Typology Code available for payment source | Attending: Family Medicine | Admitting: Internal Medicine

## 2012-10-25 VITALS — BP 124/76 | HR 69 | Temp 99.2°F | Resp 18 | Ht 63.0 in | Wt 171.0 lb

## 2012-10-25 DIAGNOSIS — N898 Other specified noninflammatory disorders of vagina: Secondary | ICD-10-CM

## 2012-10-25 DIAGNOSIS — L293 Anogenital pruritus, unspecified: Secondary | ICD-10-CM

## 2012-10-25 HISTORY — DX: Other specified noninflammatory disorders of vagina: N89.8

## 2012-10-25 LAB — POCT URINALYSIS DIPSTICK
Bilirubin, UA: NEGATIVE
Glucose, UA: NEGATIVE
Leukocytes, UA: NEGATIVE
Nitrite, UA: NEGATIVE
Urobilinogen, UA: 0.2

## 2012-10-25 MED ORDER — METRONIDAZOLE 0.75 % EX CREA: TOPICAL_CREAM | CUTANEOUS | Status: DC

## 2012-10-25 MED ORDER — FLUCONAZOLE 100 MG PO TABS: 100.0000 mg | ORAL_TABLET | Freq: Every day | ORAL | Status: DC

## 2012-10-25 MED ORDER — SULFAMETHOXAZOLE-TRIMETHOPRIM 800-160 MG PO TABS: 1.0000 | ORAL_TABLET | Freq: Two times a day (BID) | ORAL | Status: DC

## 2012-10-25 NOTE — Patient Instructions (Addendum)
Cetirizine chewable tablets Qu es este medicamento? La CETIRIZINA es un antihistamnico. Este medicamento se [place] para tratar o prevenir los sntomas de [place]. Tambin puede reducir la picazn de la piel en aquellas personas con erupciones urticantes o ronchas. Este medicamento puede ser utilizado para otros usos; si tiene alguna pregunta consulte con su proveedor de atencin mdica o con su farmacutico. Qu le debo informar a mi profesional de la salud antes de tomar este medicamento? Necesita saber si usted presenta alguno de los siguientes problemas o situaciones: -enfermedad heptica -enfermedad renal -una reaccin alrgica o inusual a la cetirizina, a la hidroxizina, a otros medicamentos, alimentos, colorantes o conservadores -si est embarazada o buscando quedar embarazada -si est amamantando a un beb Cmo debo utilizar este medicamento? Tome este medicamento por va oral con un vaso de agua. Mastquelo completamente antes de tragar. Siga las instrucciones de la etiqueta del [place]. Este medicamento se puede tomar con o sin alimentos. No las tome con una frecuencia mayor a la indicada. Es posible que sea necesario que tome este medicamento durante varios das antes de que los sntomas mejoren. Hable con su pediatra para informarse acerca del uso de este medicamento en nios. Aunque este medicamento se puede recetar para condiciones selectivas, las precauciones se aplican. Sobredosis: Pngase en contacto inmediatamente con un centro toxicolgico o una sala de urgencia si usted cree que haya tomado demasiado medicamento. ATENCIN: [employer] es solo para usted. No comparta este medicamento con nadie. Qu sucede si me olvido de una dosis? Si olvida una dosis, tmela lo antes posible. Si es casi la hora de la prxima dosis, tome slo esa dosis. No tome dosis adicionales o dobles. Qu puede interactuar con este medicamento? -otros medicamentos para resfros o  Environmental consultant -teofilina Puede ser que esta lista no menciona todas las posibles interacciones. Informe a su profesional de [employer] de [employer] productos a base de hierbas, medicamentos de [place] o suplementos nutritivos que est tomando. Si usted fuma, consume bebidas alcohlicas o si utiliza drogas ilegales, indqueselo tambin a su profesional de [employer]. Algunas sustancias pueden interactuar con su medicamento. A qu debo estar atento al usar [employer]? Visite a su mdico o a su profesional de la salud para chequear su evolucin peridicamente. Consulte a su mdico si sus sntomas no mejoran. Puede experimentar somnolencia o mareos. No conduzca ni utilice maquinaria, ni haga nada que Scientist, research (life sciences) en estado de alerta hasta que sepa cmo le afecta este medicamento. No se ponga de pie ni se siente con rapidez, especialmente si es un paciente de edad avanzada. Esto reduce el riesgo de mareos o [employer]. Se le podr secar la boca. Masticar chicle sin azcar, chupar caramelos duros y tomar agua en abundancia lo ayudar a mantener la boca hmeda. Si el problema no desaparece o es severo, consulte a su mdico. Qu efectos secundarios puedo tener al [employer] este medicamento? Efectos secundarios que debe informar a su mdico o a Producer, television/film/video de la salud tan pronto como sea posible: -Therapist, art como erupcin cutnea, picazn o urticarias, hinchazn de la cara, labios o lengua -cambios en la visin o audicin -pulso cardaco rpido -alta presin sangunea -infeccin -dificultad para orinar o cambios en el volumen de orina Efectos secundarios que, por lo general, no requieren atencin mdica (debe informarlos a su mdico o a su profesional de la salud si persisten o si son molestos): -irritabilidad -dificultad para conciliar el sueo -dolor de garganta -dolor de estmago -hinchazn Puede ser  que esta lista no menciona todos los posibles efectos secundarios. Comunquese a  su mdico por asesoramiento mdico [employer]. Usted puede informar los efectos secundarios a la FDA por telfono al 1-800-FDA-1088. Dnde debo guardar mi medicina? Mantngala fuera del alcance de los nios. Gurdela a [employer], entre 15 y 30 grados C (68 y 72 grados F). Deseche todo el medicamento que no haya utilizado, despus de la fecha de vencimiento. ATENCIN: Este folleto es un resumen. Puede ser que no cubra toda la posible informacin. Si usted tiene preguntas acerca de esta medicina, consulte con su mdico, su farmacutico o su profesional de Radiographer, therapeutic.  2013, Elsevier/Gold Standard. (01/07/2006 5:15:00 PM)

## 2012-10-25 NOTE — Progress Notes (Signed)
FU for chest congestion and cough; also patient states that she has had vaginal itching for past 3 days; denies dysuria and discharge.

## 2012-10-25 NOTE — Progress Notes (Signed)
Patient ID: Tiffany Vazquez, female   DOB: 31-May-1970, 42 y.o.   MRN: [id number]   CC: Urinary urgency and dysuria  HPI: Patient is 42 year old female who presents to clinic with interpreter as she does not speaking list, she is Spanish-speaking female, main concern is 3-4 day progressively worsening urinary urgency and frequency, associated with dysuria and sensation of incomplete voiding. She describes similar events in the past. She denies fevers and chills, no specific abdominal concerns, no other systemic symptoms. She would also like referral to dentist for regular checkup.  No Known Allergies Past Medical History  Diagnosis Date  . Bronchitis   . Bronchitis    Current Outpatient Prescriptions on File Prior to Visit  Medication Sig Dispense Refill  . albuterol (PROVENTIL HFA;VENTOLIN HFA) 108 (90 BASE) MCG/ACT inhaler Inhale 2 puffs into the lungs every 6 (six) hours as needed for wheezing.  1 Inhaler  3  . desonide (DESOWEN) 0.05 % cream Apply topically 2 (two) times daily.  30 g  3  . tacrolimus (PROTOPIC) 0.1 % ointment Apply topically 2 (two) times daily.  100 g  3  . fluticasone (FLONASE) 50 MCG/ACT nasal spray Place 1 spray into the nose 2 (two) times daily.  1 g  2  . [DISCONTINUED] cetirizine (ZYRTEC) 10 MG tablet Take 1 tablet (10 mg total) by mouth daily. One tab daily for allergies  30 tablet  1   No current facility-administered medications on file prior to visit.   no known family medical history  History   Social History  . Marital Status: Married    Spouse Name: N/A    Number of Children: N/A  . Years of Education: N/A   Occupational History  . Not on file.   Social History Main Topics  . Smoking status: Never Smoker   . Smokeless tobacco: Not on file  . Alcohol Use: No  . Drug Use: No  . Sexually Active: Yes    Birth Control/ Protection: Condom   Other Topics Concern  . Not on file   Social History Narrative  . No narrative on file    Review  of Systems  Constitutional: Negative for fever, chills, diaphoresis, activity change, appetite change and fatigue.  HENT: Negative for ear pain, nosebleeds, congestion, facial swelling, rhinorrhea, neck pain, neck stiffness and ear discharge.   Eyes: Negative for pain, discharge, redness, itching and visual disturbance.  Respiratory: Negative for cough, choking, chest tightness, shortness of breath, wheezing and stridor.   Cardiovascular: Negative for chest pain, palpitations and leg swelling.  Gastrointestinal: Negative for abdominal distention.  Genitourinary: Per history of present illness Musculoskeletal: Negative for back pain, joint swelling, arthralgias and gait problem.  Neurological: Negative for dizziness, tremors, seizures, syncope, facial asymmetry, speech difficulty, weakness, light-headedness, numbness and headaches.  Hematological: Negative for adenopathy. Does not bruise/bleed easily.  Psychiatric/Behavioral: Negative for hallucinations, behavioral problems, confusion, dysphoric mood, decreased concentration and agitation.    Objective:   Filed Vitals:   10/25/12 1615  BP: 124/76  Pulse: 69  Temp: 99.2 F (37.3 C)  Resp: 18    Physical Exam  Constitutional: Appears well-developed and well-nourished. No distress.  CVS: RRR, S1/S2 +, no murmurs, no gallops, no carotid bruit.  Pulmonary: Effort and breath sounds normal, no stridor, rhonchi, wheezes, rales.  Abdominal: Soft. BS +,  no distension, tenderness, rebound or guarding.  Musculoskeletal: Normal range of motion. No edema and no tenderness.   Lab Results  Component Value Date   WBC  4.7 03/22/2008   HGB 13.9 03/22/2008   HCT 41.9 03/22/2008   MCV 96.5 03/22/2008   PLT 247 03/22/2008   Lab Results  Component Value Date   CREATININE 0.67 07/11/2008   BUN 15 07/11/2008   NA 139 07/11/2008   K 4.0 07/11/2008   CL 102 07/11/2008   CO2 19 07/11/2008    No results found for this basename: HGBA1C   Lipid Panel  No  results found for this basename: chol, trig, hdl, cholhdl, vldl, ldlcalc       Assessment and plan:   Patient Active Problem List   Diagnosis Date Noted  .  urinary urgency and frequency  - possibly related to UTI, will obtain urinalysis for clear evaluation. I will go ahead and start empiric antibiotic Bactrim for 5 days. Patient was advised to come back and see [place] if her symptoms do not improve or get worse over the next 5-7 days. Hygiene discussed in detail  10/25/2012       Dental referral provided per patient request

## 2013-02-16 ENCOUNTER — Other Ambulatory Visit: Payer: Self-pay | Admitting: Family Medicine

## 2013-02-16 MED ORDER — TACROLIMUS 0.1 % EX OINT: TOPICAL_OINTMENT | Freq: Two times a day (BID) | CUTANEOUS | Status: DC

## 2013-03-15 ENCOUNTER — Inpatient Hospital Stay (HOSPITAL_COMMUNITY)
Admission: AD | Admit: 2013-03-15 | Discharge: 2013-03-15 | Disposition: A | Discharge status: Home / Self Care | Payer: MEDICAID | Source: Ambulatory Visit | Attending: Obstetrics & Gynecology | Admitting: Obstetrics & Gynecology

## 2013-03-15 ENCOUNTER — Encounter (HOSPITAL_COMMUNITY): Payer: Self-pay

## 2013-03-15 ENCOUNTER — Inpatient Hospital Stay (HOSPITAL_COMMUNITY): Payer: MEDICAID

## 2013-03-15 DIAGNOSIS — M545 Low back pain, unspecified: Secondary | ICD-10-CM | POA: Insufficient documentation

## 2013-03-15 DIAGNOSIS — O209 Hemorrhage in early pregnancy, unspecified: Secondary | ICD-10-CM

## 2013-03-15 DIAGNOSIS — O368390 Maternal care for abnormalities of the fetal heart rate or rhythm, unspecified trimester, not applicable or unspecified: Secondary | ICD-10-CM | POA: Insufficient documentation

## 2013-03-15 DIAGNOSIS — O4690 Antepartum hemorrhage, unspecified, unspecified trimester: Secondary | ICD-10-CM

## 2013-03-15 DIAGNOSIS — O3410 Maternal care for benign tumor of corpus uteri, unspecified trimester: Secondary | ICD-10-CM | POA: Insufficient documentation

## 2013-03-15 DIAGNOSIS — D259 Leiomyoma of uterus, unspecified: Secondary | ICD-10-CM | POA: Insufficient documentation

## 2013-03-15 DIAGNOSIS — Z3490 Encounter for supervision of normal pregnancy, unspecified, unspecified trimester: Secondary | ICD-10-CM

## 2013-03-15 LAB — URINALYSIS, ROUTINE W REFLEX MICROSCOPIC
Nitrite: NEGATIVE
Protein, ur: NEGATIVE mg/dL
Urobilinogen, UA: 0.2 mg/dL (ref 0.0–1.0)

## 2013-03-15 LAB — CBC
MCV: 92.4 fL (ref 78.0–100.0)
Platelets: 265 10*3/uL (ref 150–400)
RDW: 12.4 % (ref 11.5–15.5)
WBC: 6.9 10*3/uL (ref 4.0–10.5)

## 2013-03-15 LAB — HCG, QUANTITATIVE, PREGNANCY: hCG, Beta Chain, Quant, S: 5159 m[IU]/mL — ABNORMAL HIGH (ref <5)

## 2013-03-15 LAB — URINE MICROSCOPIC-ADD ON

## 2013-03-15 LAB — WET PREP, GENITAL
Trich, Wet Prep: NONE SEEN
Yeast Wet Prep HPF POC: NONE SEEN

## 2013-03-15 NOTE — MAU Note (Signed)
Bright red spotting since Sunday. Lower back pain that is constant. No abdominal pain.

## 2013-03-15 NOTE — MAU Note (Signed)
Pt states she has had back pain and a small amount of vaginal bleeding that started Sunday

## 2013-03-15 NOTE — MAU Provider Note (Signed)
History     CSN: [id number]  Arrival date and time: 03/15/13 1478   First Provider Initiated Contact with Patient 03/15/13 1956      Chief Complaint  Patient presents with  . Vaginal Bleeding   HPI  [name] is a 42 y.o. female G4P0 at [redacted]w[redacted]d who presents with vaginal bleeding in early pregnancy. The bleeding started Sunday and it was small amount that she noticed when she wiped. She is having lower back pain; bilateral that she has had for a long time. Currently she rates her pain 2/10. Today the bleeding is similar to a light period.  On Sunday she felt like she was getting a UTI; she drank cranberry juice and put a warm towel on her abdomen and the symptoms went away. The patient has an appointment scheduled with the health department this month.   OB History   Grav Para Term Preterm Abortions TAB SAB Ect Mult Living   4         3      Past Medical History  Diagnosis Date  . Bronchitis   . Bronchitis     History reviewed. No pertinent past surgical history.  History reviewed. No pertinent family history.  History  Substance Use Topics  . Smoking status: Never Smoker   . Smokeless tobacco: Not on file  . Alcohol Use: No    Allergies: No Known Allergies  Prescriptions prior to admission  Medication Sig Dispense Refill  . albuterol (PROVENTIL HFA;VENTOLIN HFA) 108 (90 BASE) MCG/ACT inhaler Inhale 2 puffs into the lungs every 6 (six) hours as needed for wheezing.  1 Inhaler  3  . desonide (DESOWEN) 0.05 % cream Apply topically 2 (two) times daily.  30 g  3  . fluconazole (DIFLUCAN) 100 MG tablet Take 1 tablet (100 mg total) by mouth daily.  7 tablet  0  . fluticasone (FLONASE) 50 MCG/ACT nasal spray Place 1 spray into the nose 2 (two) times daily.  1 g  2  . metroNIDAZOLE (METROCREAM) 0.75 % cream Apply topically as directed.  45 g  3  . sulfamethoxazole-trimethoprim (SEPTRA DS) 800-160 MG per tablet Take 1 tablet by mouth 2 (two) times daily.  10  tablet  0  . tacrolimus (PROTOPIC) 0.1 % ointment Apply topically 2 (two) times daily.  100 g  3   Results for orders placed during the hospital encounter of 03/15/13 (from the past 24 hour(s))  HCG, QUANTITATIVE, PREGNANCY     Status: Abnormal   Collection Time    03/15/13  7:25 PM      Result Value Range   hCG, Beta Chain, Quant, S 5159 (*) <5 mIU/mL  CBC     Status: None   Collection Time    03/15/13  7:25 PM      Result Value Range   WBC 6.9  4.0 - 10.5 K/uL   RBC 4.10  3.87 - 5.11 MIL/uL   Hemoglobin 13.0  12.0 - 15.0 g/dL   HCT 29.5  62.1 - 30.8 %   MCV 92.4  78.0 - 100.0 fL   MCH 31.7  26.0 - 34.0 pg   MCHC 34.3  30.0 - 36.0 g/dL   RDW 65.7  84.6 - 96.2 %   Platelets 265  150 - 400 K/uL  ABO/RH     Status: None   Collection Time    03/15/13  7:28 PM      Result Value Range   ABO/RH(D) A  POS    URINALYSIS, ROUTINE W REFLEX MICROSCOPIC     Status: Abnormal   Collection Time    03/15/13  7:37 PM      Result Value Range   Color, Urine STRAW (*) YELLOW   APPearance CLEAR  CLEAR   Specific Gravity, Urine 1.020  1.005 - 1.030   pH 5.5  5.0 - 8.0   Glucose, UA NEGATIVE  NEGATIVE mg/dL   Hgb urine dipstick LARGE (*) NEGATIVE   Bilirubin Urine NEGATIVE  NEGATIVE   Ketones, ur NEGATIVE  NEGATIVE mg/dL   Protein, ur NEGATIVE  NEGATIVE mg/dL   Urobilinogen, UA 0.2  0.0 - 1.0 mg/dL   Nitrite NEGATIVE  NEGATIVE   Leukocytes, UA NEGATIVE  NEGATIVE  URINE MICROSCOPIC-ADD ON     Status: None   Collection Time    03/15/13  7:37 PM      Result Value Range   Squamous Epithelial / LPF RARE  RARE   WBC, UA 0-2  <3 WBC/hpf   RBC / HPF 0-2  <3 RBC/hpf   Bacteria, UA RARE  RARE  WET PREP, GENITAL     Status: Abnormal   Collection Time    03/15/13  9:00 PM      Result Value Range   Yeast Wet Prep HPF POC NONE SEEN  NONE SEEN   Trich, Wet Prep NONE SEEN  NONE SEEN   Clue Cells Wet Prep HPF POC NONE SEEN  NONE SEEN   WBC, Wet Prep HPF POC FEW (*) NONE SEEN   US Ob Comp Less 14  Wks  03/15/2013   CLINICAL DATA:  Pregnant, bleeding, back pain.  EXAM: OBSTETRIC <14 WK [place] AND TRANSVAGINAL OB US  TECHNIQUE: Both transabdominal and transvaginal ultrasound examinations were performed for complete evaluation of the gestation as well as the maternal uterus, adnexal regions, and pelvic cul-de-sac. Transvaginal technique was performed to assess early pregnancy.  COMPARISON:  06/04/2004  FINDINGS: Intrauterine gestational sac: Single.  Yolk sac:  Present  Embryo:  Present  Cardiac Activity: Present  Heart Rate:  62 bpm  CRL:   3.5  mm   6 w 0d                  [place] EDC: 11/08/2013  Maternal uterus/adnexae: No evidence of subchorionic hemorrhage. Two hypoechoic fibroids, right fundus 2 x 2.3 x 2.4 cm, and anterior body 8 x 9 x 11 mm. Probable left ovarian corpus luteum cyst. Right ovary was not visualized.  IMPRESSION: Single live intrauterine gestation.  Uterine fibroids.   Electronically Signed   By: Oley Balm M.D.   On: 03/15/2013 20:26   US Ob Transvaginal  03/15/2013   CLINICAL DATA:  Pregnant, bleeding, back pain.  EXAM: OBSTETRIC <14 WK [place] AND TRANSVAGINAL OB US  TECHNIQUE: Both transabdominal and transvaginal ultrasound examinations were performed for complete evaluation of the gestation as well as the maternal uterus, adnexal regions, and pelvic cul-de-sac. Transvaginal technique was performed to assess early pregnancy.  COMPARISON:  06/04/2004  FINDINGS: Intrauterine gestational sac: Single.  Yolk sac:  Present  Embryo:  Present  Cardiac Activity: Present  Heart Rate:  62 bpm  CRL:   3.5  mm   6 w 0d                  [place] EDC: 11/08/2013  Maternal uterus/adnexae: No evidence of subchorionic hemorrhage. Two hypoechoic fibroids, right fundus 2 x 2.3 x 2.4 cm, and anterior body 8 x  9 x 11 mm. Probable left ovarian corpus luteum cyst. Right ovary was not visualized.  IMPRESSION: Single live intrauterine gestation.  Uterine fibroids.   Electronically Signed   By: Oley Balm M.D.   On:  03/15/2013 20:26     Review of Systems  Gastrointestinal: Positive for diarrhea. Negative for nausea, vomiting, abdominal pain and constipation.  Genitourinary: Negative for dysuria, urgency, frequency and hematuria.       No vaginal discharge. + vaginal bleeding. No dysuria.   Musculoskeletal: Positive for back pain.   Physical Exam   Blood pressure 138/49, pulse 74, temperature 98.9 F (37.2 C), temperature source Oral, resp. rate 18, height 5\' 3"  (1.6 m), weight 80.74 kg (178 lb), last menstrual period 01/20/2013, SpO2 100.00%.  Physical Exam  Constitutional: She is oriented to person, place, and time. She appears well-developed and well-nourished. No distress.  HENT:  Head: Normocephalic.  Eyes: Pupils are equal, round, and reactive to light.  Neck: Neck supple.  Respiratory: Effort normal.  GI: Soft. She exhibits no distension. There is no tenderness. There is no rebound and no guarding.  Genitourinary:  Speculum exam: Vagina - Small amount of dark red blood in vaginal canal, no odor Cervix - small amount of active bleeding from cervical os Bimanual exam: Cervix closed, no CMT  Uterus non tender, normal size; gravid Adnexa non tender, no masses bilaterally GC/Chlam, wet prep done Chaperone present for exam.   Neurological: She is alert and oriented to person, place, and time.  Skin: Skin is warm and dry. She is not diaphoretic.    MAU Course  Procedures None  MDM Wet prep [place] UA GC/Chlamydia ABO/RH Beta Hcg   A positive blood type.  Assessment and Plan  A: IUP with cardiac activity; fetal bradycardia  Vaginal bleeding in early pregnancy  Uterine fibroids   P: Discharge home Bleeding precautions discussed Pelvic rest  Return to MAU as needed, if symptoms worsen Keep your follow up appointment with the health dept.  RASCH, JENNIFER IRENE FNP-C  03/15/2013, 9:07 PM

## 2013-03-16 LAB — ABO/RH: ABO/RH(D): A POS

## 2013-03-17 LAB — GC/CHLAMYDIA PROBE AMP: CT Probe RNA: NEGATIVE

## 2013-04-13 ENCOUNTER — Encounter: Payer: Self-pay | Admitting: *Deleted

## 2013-04-18 ENCOUNTER — Encounter (HOSPITAL_COMMUNITY): Payer: Self-pay

## 2013-04-18 ENCOUNTER — Inpatient Hospital Stay (HOSPITAL_COMMUNITY): Payer: Medicaid Other

## 2013-04-18 ENCOUNTER — Inpatient Hospital Stay (HOSPITAL_COMMUNITY)
Admission: AD | Admit: 2013-04-18 | Discharge: 2013-04-18 | Disposition: A | Discharge status: Home / Self Care | Payer: Medicaid Other | Source: Ambulatory Visit | Attending: Obstetrics & Gynecology | Admitting: Obstetrics & Gynecology

## 2013-04-18 DIAGNOSIS — O039 Complete or unspecified spontaneous abortion without complication: Secondary | ICD-10-CM | POA: Insufficient documentation

## 2013-04-18 LAB — URINALYSIS, ROUTINE W REFLEX MICROSCOPIC
Bilirubin Urine: NEGATIVE
Glucose, UA: NEGATIVE mg/dL
Ketones, ur: NEGATIVE mg/dL
Protein, ur: NEGATIVE mg/dL

## 2013-04-18 LAB — URINE MICROSCOPIC-ADD ON

## 2013-04-18 LAB — CBC
HCT: 37.4 % (ref 36.0–46.0)
Hemoglobin: 12.8 g/dL (ref 12.0–15.0)
MCH: 31.8 pg (ref 26.0–34.0)
MCHC: 34.2 g/dL (ref 30.0–36.0)
MCV: 93 fL (ref 78.0–100.0)

## 2013-04-18 NOTE — MAU Provider Note (Signed)
History of Present Illness   Patient Identification Tiffany Vazquez is a 42 y.o. female.  Patient information was obtained from patient. History/Exam limitations: none. Patient presented to the MAU by private vehicle.  Chief Complaint  Vaginal Bleeding and Back Pain  [name] is a 2 yof G4P3 who presents today c/o vaginal bleeding x 1 day.  [name] states she has had bleeding episodes with this pregnancy last month and has received pre-natal care, however has not been given a definitive answer as to the cause of the bleeding.  Pt states that the bleeding she is experiencing today is post-coital from yesterday, however has not stopped.  She c/o minor "pressure" in her hypogastric region of her abdomen, and in her lower back, however denies pain.  She denies dysuria.     Past Medical History  Diagnosis Date  . Bronchitis   . Bronchitis    History reviewed. No pertinent family history. Scheduled Meds: Continuous Infusions: PRN Meds:    Allergies  Allergen Reactions  . Other     Caffeine & spicy foods flare up Rosacea   History   Social History  . Marital Status: Married    Spouse Name: N/A    Number of Children: N/A  . Years of Education: N/A   Occupational History  . Not on file.   Social History Main Topics  . Smoking status: Never Smoker   . Smokeless tobacco: Not on file  . Alcohol Use: No  . Drug Use: No  . Sexual Activity: Yes    Birth Control/ Protection: Condom   Other Topics Concern  . Not on file   Social History Narrative  . No narrative on file   Results for orders placed during the hospital encounter of 04/18/13 (from the past 24 hour(s))  URINALYSIS, ROUTINE W REFLEX MICROSCOPIC     Status: Abnormal   Collection Time    04/18/13 10:20 AM      Result Value Range   Color, Urine YELLOW  YELLOW   APPearance HAZY (*) CLEAR   Specific Gravity, Urine 1.025  1.005 - 1.030   pH 6.0  5.0 - 8.0   Glucose, UA NEGATIVE  NEGATIVE mg/dL   Hgb  urine dipstick LARGE (*) NEGATIVE   Bilirubin Urine NEGATIVE  NEGATIVE   Ketones, ur NEGATIVE  NEGATIVE mg/dL   Protein, ur NEGATIVE  NEGATIVE mg/dL   Urobilinogen, UA 0.2  0.0 - 1.0 mg/dL   Nitrite NEGATIVE  NEGATIVE   Leukocytes, UA NEGATIVE  NEGATIVE  URINE MICROSCOPIC-ADD ON     Status: None   Collection Time    04/18/13 10:20 AM      Result Value Range   RBC / HPF TOO NUMEROUS TO COUNT  <3 RBC/hpf  CBC     Status: None   Collection Time    04/18/13  1:32 PM      Result Value Range   WBC 6.7  4.0 - 10.5 K/uL   RBC 4.02  3.87 - 5.11 MIL/uL   Hemoglobin 12.8  12.0 - 15.0 g/dL   HCT 08.6  57.8 - 46.9 %   MCV 93.0  78.0 - 100.0 fL   MCH 31.8  26.0 - 34.0 pg   MCHC 34.2  30.0 - 36.0 g/dL   RDW 62.9  52.8 - 41.3 %   Platelets 251  150 - 400 K/uL  HCG, QUANTITATIVE, PREGNANCY     Status: None   Collection Time    04/18/13  1:35 PM      Result Value Range   hCG, Beta Chain, Quant, S <1  <5 mIU/mL   Review of Systems Pertinent items are noted in HPI.   Physical Exam   BP 101/85  Pulse 80  Temp(Src) 98.3 F (36.8 C) (Oral)  Resp 16  SpO2 97%  LMP 01/20/2013 General:   alert, cooperative and no distress  Abdomen: soft, non-tender, without masses or organomegaly and normal bowel sounds  Pelvic:    Vulva: Bright, red blood covering entirety of perineum   Vagina:  Moderate amount of blood noted.     Cervix: multiparous appearance, no cervical motion tenderness and .  Active bleed noted after clearing cervical os with swab.    Uterus: normal size, non-tender   Adnexa: normal adnexa   Bimanual exam: Cervix open to appx 0.5cm with no tenderness noted.     Assessment/Plan   1. Possible miscarriage - CBC, Pelvic/transvaginal US  Evaluation and management procedures were performed by the PA student under my supervision and collaboration. I have reviewed the note and chart, and I agree with the management and plan.   MDM  O positive blood type CBC Bleeding is likely  menstrual cycle given Quant and [place] report.    A: SAB  P: Discharge home Follow up in the clinic; the clinic will call you to schedule an appointment Bleeding precautions discussed Return to MAU as needed, if symptoms worsen Support given  Iona Hansen Shawn Dannenberg, NP 04/18/2013 6:07 PM

## 2013-04-18 NOTE — MAU Provider Note (Signed)
Attestation of Attending Supervision of Advanced Practitioner (PA/CNM/NP): Evaluation and management procedures were performed by the Advanced Practitioner under my supervision and collaboration.  I have reviewed the Advanced Practitioner's note and chart, and I agree with the management and plan.  Saylee Sherrill, MD, FACOG Attending Obstetrician & Gynecologist Faculty Practice, [facility]  

## 2013-04-18 NOTE — MAU Note (Signed)
Patient is in with c/o new onset vaginal bleeding which started after intercourse yesterday and lower back pain. The pad that she placed about ago is mildly saturated with bright red blood.

## 2013-04-21 ENCOUNTER — Ambulatory Visit (INDEPENDENT_AMBULATORY_CARE_PROVIDER_SITE_OTHER): Payer: Medicaid Other | Admitting: Advanced Practice Midwife

## 2013-04-21 ENCOUNTER — Encounter: Payer: Self-pay | Admitting: Advanced Practice Midwife

## 2013-04-21 VITALS — BP 121/77 | HR 61 | Wt 175.9 lb

## 2013-04-21 DIAGNOSIS — O039 Complete or unspecified spontaneous abortion without complication: Secondary | ICD-10-CM

## 2013-04-21 NOTE — Progress Notes (Signed)
   Subjective:    Patient ID: Tiffany Vazquez, female    DOB: 04-03-1971, 42 y.o.   MRN: [id number]  HPI This is a 42 y.o. female who is s/o SAB on 04/18/13.  Had been cramping and bleeding and passed tissue at home. Quant was <1. Had a visit in MAU on 10/28 with US done showing live 6 week fetus.   Worried this is her fault or that since she did not see the embryo, she did something wrong.  Review of Systems  Constitutional: Negative for chills and activity change.  Gastrointestinal: Negative for abdominal distention.  Genitourinary: Positive for vaginal bleeding (small). Negative for difficulty urinating.  Neurological: Negative for dizziness.   Some LLQ cramping, + constipation    Objective:   Physical Exam  Constitutional: She is oriented to person, place, and time. She appears well-developed and well-nourished. No distress.  HENT:  Head: Normocephalic.  Cardiovascular: Normal rate.   Pulmonary/Chest: Effort normal.  Abdominal: Soft. She exhibits no distension and no mass. There is tenderness (slight LLQ). There is no rebound and no guarding.  Genitourinary:  Deferred due to exam 3 days ago   Musculoskeletal: Normal range of motion.  Neurological: She is alert and oriented to person, place, and time.  Skin: Skin is warm and dry.  Psychiatric: She has a normal mood and affect.          Assessment & Plan:  A:  SIUP at 6 weeks, s/p SAB   P:  Discussed SAB and issues surrounding it, such as feeling guilty      Reassured her there was nothing she did wrong, and that even if she had seen the embryo, we could not have saved it.      Discussed contraception. Is Catholic and wants to use only NFP and condoms

## 2013-04-21 NOTE — Patient Instructions (Signed)
Aborto espontneo  (Miscarriage) El aborto espontneo es la prdida de un beb que no ha nacido (feto) antes de la semana 20 del Media planner. La mayor parte de estos abortos ocurre en los primeros 3 meses. En algunos casos ocurre antes de que la mujer sepa que est [place]. Tambin se denomina "aborto espontneo" o "prdida prematura del embarazo". El aborto espontneo puede ser Ardelia Mems experiencia que afecte emocionalmente a Geologist, engineering. Converse con su mdico si tiene dudas, cmo es el proceso de [place], y sobre planes futuros de Media planner.  CAUSAS   Algunos problemas cromosmicos pueden hacer imposible que el beb se desarrolle normalmente. Los problemas con los genes o cromosomas del beb son generalmente el resultado de errores que se producen, por casualidad, cuando el embrin se divide y crece. Estos problemas no se heredan de los [place].  Infeccin en el cuello del tero.   Problemas hormonales.   Problemas en el cuello del tero, como tener un tero incompetente. Esto ocurre cuando los tejidos no son lo suficientemente fuertes como para Risk manager.   Problemas del tero, como un tero con forma anormal, los fibromas o anormalidades congnitas.   Ciertas enfermedades crnicas.   No fume, no beba alcohol, ni consuma drogas.   Traumatismos  A veces, la causa es desconocida.  SNTOMAS   Sangrado o manchado vaginal, con o sin clicos o dolor.  Dolor o clicos en el abdomen o en la cintura.  Eliminacin de lquido, tejidos o cogulos grandes por la vagina. DIAGNSTICO  El [employer] har un examen fsico. Tambin le indicar una ecografa para confirmar el aborto. Es posible que se realicen anlisis de [place].  TRATAMIENTO   En algunos casos el tratamiento no es necesario, si se eliminan naturalmente todos los tejidos embrionarios que se encontraban en el tero. Si el feto o la placenta quedan dentro del tero (aborto incompleto), pueden infectarse, los tejidos que quedan  pueden infectarse y deben retirarse. Generalmente se realiza un procedimiento de dilatacin y curetaje (D y C). Durante el procedimiento de dilatacin y curetaje, el cuello del tero se abre (dilata) y se retira cualquier resto de tejido fetal o placentario del tero.  Si hay una infeccin, le recetarn antibiticos. Podrn recetarle otros medicamentos para reducir el tamao del tero (contraerlo) si hay una mucho sangrado.  Si su sangre es Rh negativa y su beb es Rh positivo, usted necesitar la inyeccin de inmunoglobulina Rh. Esta inyeccin proteger a los futuros bebs de tener problemas de compatibilidad Rh en futuros embarazos. INSTRUCCIONES PARA EL CUIDADO EN EL HOGAR   El mdico le indicar reposo en cama o le permitir Automotive engineer. Vuelva a la actividad lentamente o segn las indicaciones de su mdico.  Pdale a alguien que la ayude con las responsabilidades familiares y del hogar durante este tiempo.   Lleve un registro de la cantidad y la saturacin de las toallas higinicas que Medical laboratory scientific officer. Anote esta informacin   No use tampones. No No se haga duchas vaginales ni tenga relaciones sexuales hasta que el mdico la autorice.   Slo tome medicamentos de venta libre o recetados para Glass blower/designer o Health and safety inspector, segn las indicaciones de su mdico.   No tome aspirina. La aspirina puede ocasionar hemorragias.   Concurra puntualmente a las citas de control con el mdico.   Si usted o su pareja tienen dificultades con el duelo, hable con su mdico para buscar la ayuda psicolgica que los ayude a enfrentar la prdida  del embarazo. Permtase el tiempo suficiente de duelo antes de quedar embarazada nuevamente.  SOLICITE ATENCIN MDICA DE INMEDIATO SI:   Siente calambres intensos o dolor en la espalda o en el abdomen.  Tiene fiebre.  Elimina grandes cogulos de [place] (del tamao de una nuez o ms) o tejidos por la vagina. Guarde lo que ha eliminado para  que su mdico lo examine.   La hemorragia aumenta.   Brett Fairy secrecin vaginal espesa y con mal olor.  Se siente mareada, dbil, o se desmaya.   Siente escalofros.  ASEGRESE DE QUE:   Comprende estas instrucciones.  Controlar su enfermedad.  Solicitar ayuda de inmediato si no mejora o si empeora. Document Released: 02/12/2005 Document Revised: 08/30/2012 [facility] Patient Information 2014 [place], [place]. Miscarriage A miscarriage is the sudden loss of an unborn baby (fetus) before the 20th week of pregnancy. Most miscarriages happen in the first 3 months of pregnancy. Sometimes, it happens before a woman even knows she is pregnant. A miscarriage is also called a "spontaneous miscarriage" or "early pregnancy loss." Having a miscarriage can be an emotional experience. Talk with your caregiver about any questions you may have about miscarrying, the grieving process, and your future pregnancy plans. CAUSES   Problems with the fetal chromosomes that make it impossible for the baby to develop normally. Problems with the baby's genes or chromosomes are most often the result of errors that occur, by chance, as the embryo divides and grows. The problems are not inherited from the parents.  Infection of the cervix or uterus.   Hormone problems.   Problems with the cervix, such as having an incompetent cervix. This is when the tissue in the cervix is not strong enough to hold the pregnancy.   Problems with the uterus, such as an abnormally shaped uterus, uterine fibroids, or congenital abnormalities.   Certain medical conditions.   Smoking, drinking alcohol, or taking illegal drugs.   Trauma.  Often, the cause of a miscarriage is unknown.  SYMPTOMS   Vaginal bleeding or spotting, with or without cramps or pain.  Pain or cramping in the abdomen or lower back.  Passing fluid, tissue, or blood clots from the vagina. DIAGNOSIS  Your caregiver will perform a physical  exam. You may also have an ultrasound to confirm the miscarriage. Blood or urine tests may also be ordered. TREATMENT   Sometimes, treatment is not necessary if you naturally pass all the fetal tissue that was in the uterus. If some of the fetus or placenta remains in the body (incomplete miscarriage), tissue left behind may become infected and must be removed. Usually, a dilation and curettage (D and C) procedure is performed. During a D and C procedure, the cervix is widened (dilated) and any remaining fetal or placental tissue is gently removed from the uterus.  Antibiotic medicines are prescribed if there is an infection. Other medicines may be given to reduce the size of the uterus (contract) if there is a lot of bleeding.  If you have Rh negative blood and your baby was Rh positive, you will need a Rh immunoglobulin shot. This shot will protect any future baby from having Rh blood problems in future pregnancies. HOME CARE INSTRUCTIONS   Your caregiver may order bed rest or may allow you to continue light activity. Resume activity as directed by your caregiver.  Have someone help with home and family responsibilities during this time.   Keep track of the number of sanitary pads you use each day  and how soaked (saturated) they are. Write down this information.   Do not use tampons. Do not douche or have sexual intercourse until approved by your caregiver.   Only take over-the-counter or prescription medicines for pain or discomfort as directed by your caregiver.   Do not take aspirin. Aspirin can cause bleeding.   Keep all follow-up appointments with your caregiver.   If you or your partner have problems with grieving, talk to your caregiver or seek counseling to help cope with the pregnancy loss. Allow enough time to grieve before trying to get pregnant again.  SEEK IMMEDIATE MEDICAL CARE IF:   You have severe cramps or pain in your back or abdomen.  You have a fever.  You  pass large blood clots (walnut-sized or larger) ortissue from your vagina. Save any tissue for your caregiver to inspect.   Your bleeding increases.   You have a thick, bad-smelling vaginal discharge.  You become lightheaded, weak, or you faint.   You have chills.  MAKE SURE YOU:  Understand these instructions.  Will watch your condition.  Will get help right away if you are not doing well or get worse. Document Released: 10/29/2000 Document Revised: 08/30/2012 Document Reviewed: 06/24/2011 [facility] Patient Information 2014 [place], [place].

## 2013-04-26 ENCOUNTER — Encounter: Payer: Self-pay | Admitting: Advanced Practice Midwife

## 2013-05-16 ENCOUNTER — Ambulatory Visit (INDEPENDENT_AMBULATORY_CARE_PROVIDER_SITE_OTHER): Payer: Medicaid Other | Admitting: Advanced Practice Midwife

## 2013-05-16 DIAGNOSIS — O039 Complete or unspecified spontaneous abortion without complication: Secondary | ICD-10-CM

## 2013-05-16 NOTE — Progress Notes (Signed)
Appointment made in error.  Already seen by me for post SAB. Doing well, no complaints  No charge

## 2013-09-21 ENCOUNTER — Ambulatory Visit (HOSPITAL_COMMUNITY)
Admission: RE | Admit: 2013-09-21 | Discharge: 2013-09-21 | Disposition: A | Discharge status: Home / Self Care | Payer: No Typology Code available for payment source | Source: Ambulatory Visit | Attending: Internal Medicine | Admitting: Internal Medicine

## 2013-09-21 ENCOUNTER — Other Ambulatory Visit (HOSPITAL_COMMUNITY): Payer: Self-pay | Admitting: Internal Medicine

## 2013-09-21 DIAGNOSIS — J40 Bronchitis, not specified as acute or chronic: Secondary | ICD-10-CM

## 2013-09-21 DIAGNOSIS — J45909 Unspecified asthma, uncomplicated: Secondary | ICD-10-CM

## 2014-03-20 ENCOUNTER — Encounter: Payer: Self-pay | Admitting: Advanced Practice Midwife

## 2014-05-01 ENCOUNTER — Emergency Department (HOSPITAL_COMMUNITY)
Admission: EM | Admit: 2014-05-01 | Discharge: 2014-05-01 | Disposition: A | Discharge status: Home / Self Care | Payer: No Typology Code available for payment source | Source: Home / Self Care | Attending: Family Medicine | Admitting: Family Medicine

## 2014-05-01 ENCOUNTER — Encounter (HOSPITAL_COMMUNITY): Payer: Self-pay | Admitting: Emergency Medicine

## 2014-05-01 DIAGNOSIS — T7840XA Allergy, unspecified, initial encounter: Secondary | ICD-10-CM

## 2014-05-01 DIAGNOSIS — L299 Pruritus, unspecified: Secondary | ICD-10-CM

## 2014-05-01 MED ORDER — HYDROXYZINE HCL 10 MG PO TABS: 10.0000 mg | ORAL_TABLET | ORAL | Status: DC | PRN

## 2014-05-01 MED ORDER — METHYLPREDNISOLONE SODIUM SUCC 125 MG IJ SOLR
INTRAMUSCULAR | Status: AC
  Filled 2014-05-01: qty 2

## 2014-05-01 MED ORDER — PREDNISONE 10 MG PO TABS: ORAL_TABLET | ORAL | Status: DC

## 2014-05-01 MED ORDER — METHYLPREDNISOLONE SODIUM SUCC 125 MG IJ SOLR
125.0000 mg | Freq: Once | INTRAMUSCULAR | Status: AC
  Administered 2014-05-01: 125 mg via INTRAMUSCULAR

## 2014-05-01 NOTE — ED Provider Notes (Signed)
CSN: [id number]     Arrival date & time 05/01/14  4174 History   First MD Initiated Contact with Patient 05/01/14 [phone]     Chief Complaint  Patient presents with  . Allergic Reaction   (Consider location/radiation/quality/duration/timing/severity/associated sxs/prior Treatment) HPI        43 year old presents for evaluation of a rash. For 6 days she has an itchy red rash on her face and arms. This started after she ate and caviar, she thinks she may have a reaction to that. She has a history of rosacea but her face has never been this red or itchy before. She has been taking her regular rosacea creams as well as Benadryl and hydrocortisone cream without relief. Denies any wheezing or throat swelling, or difficulty breathing. No nausea or vomiting. No fever, chills, or abdominal pain.  Past Medical History  Diagnosis Date  . Bronchitis   . Bronchitis    Past Surgical History  Procedure Laterality Date  . Cesarean section     No family history on file. History  Substance Use Topics  . Smoking status: Never Smoker   . Smokeless tobacco: Not on file  . Alcohol Use: No   OB History    Gravida Para Term Preterm AB TAB SAB Ectopic Multiple Living   5 3 2 1 2  2   3      Review of Systems  Constitutional: Negative for fever and chills.  Respiratory: Negative for shortness of breath and wheezing.   Gastrointestinal: Negative for diarrhea.  Skin: Positive for rash ( see history of present illness).  All other systems reviewed and are negative.   Allergies  Other  Home Medications   Prior to Admission medications   Medication Sig Start Date End Date Taking? Authorizing Provider  desonide (DESOWEN) 0.05 % cream Apply topically 2 (two) times daily.   Yes Historical Provider, MD  METRONIDAZOLE, TOPICAL, (METROLOTION) 0.75 % LOTN Apply topically.   Yes Historical Provider, MD  albuterol (PROVENTIL HFA;VENTOLIN HFA) 108 (90 BASE) MCG/ACT inhaler Inhale 2 puffs into the lungs every 6  (six) hours as needed for wheezing. 10/08/12   Doran Heater, MD  hydrOXYzine (ATARAX/VISTARIL) 10 MG tablet Take 1 tablet (10 mg total) by mouth every 4 (four) hours as needed for itching. 05/01/14   Liam Graham, PA-C  predniSONE (DELTASONE) 10 MG tablet 4 tabs PO QD for 4 days; 3 tabs PO QD for 3 days; 2 tabs PO QD for 2 days; 1 tab PO QD for 1 day 05/01/14   Liam Graham, PA-C  Prenatal Vit-Fe Fumarate-FA (PRENATAL MULTIVITAMIN) TABS tablet Take 1 tablet by mouth daily at 12 noon.    Historical Provider, MD   BP 119/65 mmHg  Pulse 72  Temp(Src) 97.9 F (36.6 C) (Oral)  Resp 18  SpO2 99%  LMP 04/18/2014 Physical Exam  Constitutional: She is oriented to person, place, and time. Vital signs are normal. She appears well-developed and well-nourished. No distress.  HENT:  Head: Normocephalic and atraumatic.  No posterior oropharyngeal swelling or edema  Pulmonary/Chest: Effort normal. No respiratory distress. She has no wheezes.  Neurological: She is alert and oriented to person, place, and time. She has normal strength. Coordination normal.  Skin: Skin is warm and dry. Rash noted. Rash is urticarial (face and bilateral upper extremities, and upper chest). She is not diaphoretic.  Psychiatric: She has a normal mood and affect. Judgment normal.  Nursing note and vitals reviewed.   ED Course  Procedures (  including critical care time) Labs Review Labs Reviewed - No data to display  Imaging Review No results found.   MDM   1. Allergic reaction, initial encounter   2. Itching    She was given an injection of Solu-Medrol today. Treat with prednisone and hydroxyzine for itching. Follow-up when necessary if no resolution within a few days   New Prescriptions   HYDROXYZINE (ATARAX/VISTARIL) 10 MG TABLET    Take 1 tablet (10 mg total) by mouth every 4 (four) hours as needed for itching.   PREDNISONE (DELTASONE) 10 MG TABLET    4 tabs PO QD for 4 days; 3 tabs PO QD for 3 days; 2  tabs PO QD for 2 days; 1 tab PO QD for 1 day     Liam Graham, PA-C 05/01/14 1027

## 2014-05-01 NOTE — Discharge Instructions (Signed)
Alergias (Allergies) El profesional que lo asiste le ha diagnosticado que usted padece de [place]. Las Medtronic pueden ser ocasionadas por cualquier cosa a la que su organismo es sensible. Pueden ser alimentos, medicamentos, polen, sustancias qumicas y casi cualquiera de las cosas que lo rodean en su vida diaria que producen alrgenos. Un alrgeno es todo lo que hace que una sustancia produzca alergia. La herencia es uno de los factores que causa este problema. Esto significa que usted puede sufrir alguna de las alergias que sufrieron sus [place]. Las Medtronic a la comida pueden ocurrir a Hotel manager. Estn entre las ms graves y Haematologist en peligro la vida. Algunos de los alimentos que comnmente producen [place] son la [place], los frutos de mar, los [place], los frutos secos, el trigo y la soja. SNTOMAS  Hinchazn alrededor de la boca.  Una erupcin roja que produce picazn o urticaria.  Vmitos o diarrea.  Dificultad para respirar. LAS REACCIONES ALRGICAS GRAVES PONEN EN PELIGRO LA VIDA . Esta reaccin se denomina anafilaxis. Puede ocasionar que la boca y la garganta se hinchen y produzca dificultad para respirar y Chartered loss adjuster. En reacciones graves, slo una pequea cantidad del alimento (por ejemplo, aceite de cacahuate en la ensalada) puede producir la muerte en pocos segundos. Las [employer] pueden ocurrir a Hotel manager. Se denominan as porque generalmente se producen durante la misma estacin todos los aos. Puede ser Ardelia Mems reaccin al moho, al polen del csped o al polen de los rboles. Otras causas del problema son los alrgenos que contienen los caros del polvo del hogar, el pelaje de las mascotas y las esporas del moho. Los sntomas consisten en congestin nasal, picazn y secrecin nasal asociada con estornudos, y lagrimeo y [employer] ojos. Tambin puede haber picazn de la boca y los odos. Estos problemas aparecen cuando se entra en contacto con el polen  y otros alrgenos. Los alrgenos son las partculas que estn en el aire y a las que el organismo reacciona cuando existe una Risk analyst. Esto hace que usted libere anticuerpos alrgicos. A travs de una cadena de eventos, estos finalmente hacen que usted libere histamina en la corriente sangunea. Aunque esto implica una proteccin para su organismo, es lo que le produce disconfort. Ese es el motivo por el que se le han indicado antihistamnicos para sentirse mejor. Si usted no Lexicographer cul es el alrgeno que le produjo la reaccin, puede someterse a una prueba de [place] o de piel. Las alergias no pueden curarse pero pueden controlarse con medicamentos. La fiebre de heno es un grupo de trastornos alrgicos estacionales Simplemente se tratan con medicamentos de venta libre como difenhidramina (Benadryl). Tome los medicamentos segn las indicaciones. No consuma alcohol ni conduzca mientras toma este medicamento. Consulte con el profesional que lo asiste o siga las instrucciones de uso para las dosis para nios. Si estos medicamentos no le Training and development officer, existen muchos otros nuevos que el profesional que lo asiste puede prescribirle. Podrn utilizarse medicamentos ms fuertes tales como un spray nasal, colirios y corticoides si los primeros medicamentos que prueba no lo [place]. Si todos estos fracasan, puede Risk manager otros tratamientos como la inmunoterapia o las inyecciones desensibilizantes. Haga una consulta de seguimiento con el profesional que lo asiste si los problemas continan. Estas alergias estacionales no ponen en peligro la vida. Generalmente se trata de una incomodidad que puede aliviarse con medicamentos. INSTRUCCIONES PARA EL CUIDADO DOMICILIARIO  Si no est seguro de que es lo [place]  produce la reaccin, lleve un registro de los alimentos que come y los sntomas que le siguen. Evite los alimentos que le producen reacciones.  Si presenta urticaria o una erupcin  cutnea:  Tome los medicamentos como se le indic.  Puede utilizar un antihistamnico de venta libre (difenhidramina) para la urticaria y la picazn, segn sea necesario.  Aplquese compresas sobre la piel o tome baos de agua fra. Evite los baos o las duchas calientes. El calor puede hacer que la urticaria y la picazn empeoren.  Si usted es muy alrgico:  Como consecuencia de un tratamiento para una reaccin grave, puede necesitar ser hospitalizado para recibir un seguimiento intensivo.  Utilice un brazalete o collar de alerta mdico, indicando que usted es alrgico.  Usted y su familia deben aprender a administrar adrenalina o a utilizar un kit anafilctico.  Si usted ya ha sufrido una reaccin grave, siempre lleve el kit anafilctico o el EpiPen con usted. Si sufre una reaccin grave, utilice esta medicacin del modo en que se lo indic el profesional que lo asiste. Una falla puede conllevar consecuencias fatales. SOLICITE ATENCIN MDICA SI:  Sospecha que puede sufrir una alergia a algn alimento. Los sntomas generalmente ocurren dentro de los 30 minutos posteriores a haber ingerido el alimento.  Los sntomas persistieron durante 2 das o han empeorado.  Desarrolla nuevos sntomas.  Quiere volver a probar o que su hijo consuma nuevamente un alimento o bebida que usted cree que le causa una reaccin alrgica. Nunca lo haga si ha sufrido una reaccin anafilctica a ese alimento o a esa bebida con anterioridad. Slo intntelo bajo la supervisin del mdico. SOLICITE ATENCIN MDICA DE INMEDIATO SI:  Presenta dificultad para respirar, jadea o tiene una sensacin de opresin en el pecho o en la garganta.  Tiene la boca hinchada, o presenta urticaria, hinchazn o picazn en todo el cuerpo.  Ha sufrido una reaccin grave que ha respondido a medicamentos como el kit anafilctico o al EpiPen. Estas reacciones pueden volver a presentarse cuando haya terminado la medicacin. Estas  reacciones deben considerarse como que ponen en peligro la vida. EST SEGURO QUE:   Comprende las instrucciones para el alta mdica.  Controlar su enfermedad.  Solicitar atencin mdica de inmediato segn las indicaciones. Document Released: 05/05/2005 Document Revised: 08/30/2012 [employer] Patient Information 2015 [employer]. This information is not intended to replace advice given to you by your health care provider. Make sure you discuss any questions you have with your health care provider.  

## 2014-05-01 NOTE — ED Notes (Signed)
C/o allergic reaction to face/scalp, arms, chest onset 6 days Hx of rosacea; taking Desonide cream and Metrolotion Taking OTC allergy meds, hydrocortisone cream Alert, no signs of acute distress.

## 2014-08-28 ENCOUNTER — Encounter (HOSPITAL_COMMUNITY): Payer: Self-pay | Admitting: *Deleted

## 2014-08-28 ENCOUNTER — Emergency Department (INDEPENDENT_AMBULATORY_CARE_PROVIDER_SITE_OTHER): Payer: No Typology Code available for payment source

## 2014-08-28 ENCOUNTER — Emergency Department (HOSPITAL_COMMUNITY)
Admission: EM | Admit: 2014-08-28 | Discharge: 2014-08-28 | Disposition: A | Discharge status: Home / Self Care | Payer: No Typology Code available for payment source | Source: Home / Self Care | Attending: Emergency Medicine | Admitting: Emergency Medicine

## 2014-08-28 DIAGNOSIS — J40 Bronchitis, not specified as acute or chronic: Secondary | ICD-10-CM

## 2014-08-28 MED ORDER — AMOXICILLIN 500 MG PO CAPS: 500.0000 mg | ORAL_CAPSULE | Freq: Three times a day (TID) | ORAL | Status: DC

## 2014-08-28 MED ORDER — LORATADINE 10 MG PO TABS: 10.0000 mg | ORAL_TABLET | Freq: Every day | ORAL | Status: DC

## 2014-08-28 MED ORDER — PREDNISONE 50 MG PO TABS: ORAL_TABLET | ORAL | Status: DC

## 2014-08-28 MED ORDER — BENZONATATE 100 MG PO CAPS: 100.0000 mg | ORAL_CAPSULE | Freq: Three times a day (TID) | ORAL | Status: DC | PRN

## 2014-08-28 NOTE — Discharge Instructions (Signed)
You have bronchitis. Take prednisone and amoxicillin as prescribed. Use the Tessalon pearls every 8 hours as needed for cough. All of these medicines should be $4 at [facility]. With the prescription, the loratadine should be $4 at [facility]. Follow-up as needed.

## 2014-08-28 NOTE — ED Provider Notes (Signed)
CSN: [id number]     Arrival date & time 08/28/14  4818 History   First MD Initiated Contact with Patient 08/28/14 [phone]     Chief Complaint  Patient presents with  . Cough   (Consider location/radiation/quality/duration/timing/severity/associated sxs/prior Treatment) HPI Tiffany Vazquez is a 44 year old woman here for evaluation of cough. Tiffany Vazquez states her symptoms started about 10 days ago with fever, congestion, cough. After about 5 days the fever and congestion resolved. Tiffany Vazquez states Tiffany Vazquez has continued cough. Tiffany Vazquez has also developed some shortness of breath. Tiffany Vazquez has some sharp central chest pain with deep breaths only. Tiffany Vazquez has been using an allergy pill and albuterol without improvement. Tiffany Vazquez states the albuterol initially helped, but has not helped in the last day or so.  Past Medical History  Diagnosis Date  . Bronchitis   . Bronchitis    Past Surgical History  Procedure Laterality Date  . Cesarean section     No family history on file. History  Substance Use Topics  . Smoking status: Never Smoker   . Smokeless tobacco: Not on file  . Alcohol Use: No   OB History    Gravida Para Term Preterm AB TAB SAB Ectopic Multiple Living   5 3 2 1 2  2   3      Review of Systems  Constitutional: Negative for fever and chills.  HENT: Negative for congestion, ear pain, rhinorrhea and sore throat.   Respiratory: Positive for cough and shortness of breath.   Cardiovascular: Positive for chest pain.  Gastrointestinal: Negative for nausea and vomiting.    Allergies  Other  Home Medications   Prior to Admission medications   Medication Sig Start Date End Date Taking? Authorizing Provider  albuterol (PROVENTIL HFA;VENTOLIN HFA) 108 (90 BASE) MCG/ACT inhaler Inhale 2 puffs into the lungs every 6 (six) hours as needed for wheezing. 10/08/12  Yes Doran Heater, MD  desonide (DESOWEN) 0.05 % cream Apply topically 2 (two) times daily.   Yes Historical Provider, MD  hydrOXYzine (ATARAX/VISTARIL) 10 MG tablet  Take 1 tablet (10 mg total) by mouth every 4 (four) hours as needed for itching. 05/01/14  Yes Zachary H Baker, PA-C  METRONIDAZOLE, TOPICAL, (METROLOTION) 0.75 % LOTN Apply topically.   Yes Historical Provider, MD  amoxicillin (AMOXIL) 500 MG capsule Take 1 capsule (500 mg total) by mouth 3 (three) times daily. 08/28/14   Melony Overly, MD  benzonatate (TESSALON) 100 MG capsule Take 1 capsule (100 mg total) by mouth 3 (three) times daily as needed for cough. 08/28/14   Melony Overly, MD  loratadine (CLARITIN) 10 MG tablet Take 1 tablet (10 mg total) by mouth daily. 08/28/14   Melony Overly, MD  predniSONE (DELTASONE) 50 MG tablet Take 1 pill daily for 5 days. 08/28/14   Melony Overly, MD   BP 123/69 mmHg  Pulse 88  Temp(Src) 97.9 F (36.6 C) (Oral)  Resp 18  SpO2 96%  LMP 08/22/2014 (Exact Date) Physical Exam  Constitutional: Tiffany Vazquez is oriented to person, place, and time. Tiffany Vazquez appears well-developed and well-nourished. No distress.  Neck: Neck supple.  Cardiovascular: Normal rate, regular rhythm and normal heart sounds.   No murmur heard. Pulmonary/Chest: Effort normal and breath sounds normal. No respiratory distress. Tiffany Vazquez has no wheezes. Tiffany Vazquez has no rales. Tiffany Vazquez exhibits no tenderness.  Significant coughing during lung exam.  Neurological: Tiffany Vazquez is alert and oriented to person, place, and time.    ED Course  Procedures (including critical care time) Labs Review  Labs Reviewed - No data to display  Imaging Review Dg Chest 2 View  08/28/2014   CLINICAL DATA:  Cough.  EXAM: CHEST  2 VIEW  COMPARISON:  PA and lateral chest 09/21/2013.  FINDINGS: The lungs are clear. Heart size is normal. There is no pneumothorax or pleural effusion.  IMPRESSION: No acute disease.   Electronically Signed   By: Inge Rise M.D.   On: 08/28/2014 10:35     MDM   1. Bronchitis    We'll treat with prednisone and amoxicillin. Tessalon and loratadine for symptomatic improvement. Follow-up as needed.    Melony Overly, MD 08/28/14 1059

## 2014-08-28 NOTE — ED Notes (Signed)
Onset 08/18/2014 fever and cold sxs.  She is here today c/o continued nonproductive cough that won't resolve.  She has been using albuterol inhaler with minimal inprovement

## 2014-09-06 ENCOUNTER — Emergency Department (HOSPITAL_COMMUNITY)
Admission: EM | Admit: 2014-09-06 | Discharge: 2014-09-06 | Disposition: A | Discharge status: Home / Self Care | Payer: No Typology Code available for payment source | Source: Home / Self Care

## 2014-09-06 ENCOUNTER — Encounter (HOSPITAL_COMMUNITY): Payer: Self-pay

## 2014-09-06 DIAGNOSIS — J40 Bronchitis, not specified as acute or chronic: Secondary | ICD-10-CM

## 2014-09-06 MED ORDER — PREDNISONE 10 MG (48) PO TBPK: ORAL_TABLET | Freq: Every day | ORAL | Status: DC

## 2014-09-06 MED ORDER — HYDROCOD POLST-CPM POLST ER 10-8 MG/5ML PO SUER: 5.0000 mL | Freq: Every evening | ORAL | Status: DC | PRN

## 2014-09-06 NOTE — Discharge Instructions (Signed)
Your symptoms are likely due to persistent lung irritation. This may last for an additional 4-8 weeks. Please use the prednisone to help with inflammation. Please use the cough medicine only at night to help you sleep. Please continue the Tessalon during the day. Please continue taking the Claritin for allergy relief. Please use the albuterol to help with additional coughing and before exercising.

## 2014-09-06 NOTE — ED Provider Notes (Signed)
CSN: [id number]     Arrival date & time 09/06/14  0944 History   None    Chief Complaint  Patient presents with  . Cough   (Consider location/radiation/quality/duration/timing/severity/associated sxs/prior Treatment) HPI  Cough: treated at the [facility] for bronchitis on 08/28/14 w/ prednisone, amoxicillin, tessalon, and claritin. Improved for a couple days but then returned to full symptoms. Current symptoms include cough, itchy throat, HA w/ cough. Unable to sleep lying down as this makes the cough worse.   Deep breathing makes cough worse.  Symptoms since 08/18/14. Initially w/ fever for 3 days but no longer.  Denies fevers, body aches, runny nose, diarrhea, constipation.  Inhaler w/ some improvement.    Past Medical History  Diagnosis Date  . Bronchitis   . Bronchitis    Past Surgical History  Procedure Laterality Date  . Cesarean section     Family History  Problem Relation Age of Onset  . Diabetes Mother   . Diabetes Father    History  Substance Use Topics  . Smoking status: Never Smoker   . Smokeless tobacco: Not on file  . Alcohol Use: No   OB History    Gravida Para Term Preterm AB TAB SAB Ectopic Multiple Living   5 3 2 1 2  2   3      Review of Systems Per HPI with all other pertinent systems negative.   Allergies  Other  Home Medications   Prior to Admission medications   Medication Sig Start Date End Date Taking? Authorizing Provider  albuterol (PROVENTIL HFA;VENTOLIN HFA) 108 (90 BASE) MCG/ACT inhaler Inhale 2 puffs into the lungs every 6 (six) hours as needed for wheezing. 10/08/12  Yes Doran Heater, MD  benzonatate (TESSALON) 100 MG capsule Take 1 capsule (100 mg total) by mouth 3 (three) times daily as needed for cough. 08/28/14   Melony Overly, MD  chlorpheniramine-HYDROcodone (TUSSIONEX PENNKINETIC ER) 10-8 MG/5ML SUER Take 5 mLs by mouth at bedtime as needed for cough. 09/06/14   Waldemar Dickens, MD  loratadine (CLARITIN) 10 MG tablet Take 1 tablet (10  mg total) by mouth daily. 08/28/14   Melony Overly, MD  predniSONE (STERAPRED UNI-PAK 48 TAB) 10 MG (48) TBPK tablet Take by mouth daily. Take as instructed 09/06/14   Waldemar Dickens, MD   BP 142/79 mmHg  Pulse 106  Temp(Src) 97.9 F (36.6 C) (Oral)  Resp 18  SpO2 96%  LMP 08/22/2014 (Exact Date) Physical Exam Physical Exam  Constitutional: oriented to person, place, and time. appears well-developed and well-nourished. No distress.  HENT:  Head: Normocephalic and atraumatic.  Eyes: EOMI. PERRL.  Neck: Normal range of motion.  Cardiovascular: RRR, no m/r/g, 2+ distal pulses,  Pulmonary/Chest: Effort normal and breath sounds normal. No respiratory distress.  Abdominal: Soft. Bowel sounds are normal. NonTTP, no distension.  Musculoskeletal: Normal range of motion. Non ttp, no effusion.  Neurological: alert and oriented to person, place, and time.  Skin: Skin is warm. No rash noted. non diaphoretic.  Psychiatric: normal mood and affect. behavior is normal. Judgment and thought content normal.   ED Course  Procedures (including critical care time) Labs Review Labs Reviewed - No data to display  Imaging Review No results found.   MDM   1. Bronchitis    10 day steroid dose pack Tussionex QHS prn Continue tessalon, claritin Avoid chemical irritants from work for a couple days Daily exercisea dn pretreatment w/ albuterol No need for preat CXR. Previous w/o  evidence of acute process.  Reiterated to pt that this may last for several more weeks and not to be concerned unless devleops worsening symptoms.     Waldemar Dickens, MD 09/06/14 1124

## 2014-09-06 NOTE — ED Notes (Signed)
Seen her on 4-11 for cough. C/o continues to cough, minimal relief w MDI

## 2014-10-07 ENCOUNTER — Emergency Department (HOSPITAL_COMMUNITY)
Admission: EM | Admit: 2014-10-07 | Discharge: 2014-10-07 | Disposition: A | Discharge status: Home / Self Care | Payer: No Typology Code available for payment source | Source: Home / Self Care | Attending: Family Medicine | Admitting: Family Medicine

## 2014-10-07 ENCOUNTER — Encounter (HOSPITAL_COMMUNITY): Payer: Self-pay | Admitting: *Deleted

## 2014-10-07 DIAGNOSIS — T7840XA Allergy, unspecified, initial encounter: Secondary | ICD-10-CM

## 2014-10-07 HISTORY — DX: Rosacea, unspecified: L71.9

## 2014-10-07 MED ORDER — PREDNISONE 5 MG (48) PO TBPK: 5.0000 mg | ORAL_TABLET | Freq: Every day | ORAL | Status: DC

## 2014-10-07 NOTE — ED Notes (Signed)
C/O pruritic redness to face, neck and BUE x 10 days; only slightly on legs.  Has tried her normal loratadine, hydrocortisone without relief.  Denies any throat or oral swelling.

## 2014-10-07 NOTE — Discharge Instructions (Signed)
Thank you for coming in today. Call or go to the emergency room if you get worse, have trouble breathing, have chest pains, or palpitations.  Use gold bond itch and bandaryl  Alergias (Allergies) El profesional que lo asiste le ha diagnosticado que usted padece de [place]. Las [employer]onic pueden ser ocasionadas por cualquier cosa a la que su organismo es sensible. Pueden ser alimentos, medicamentos, polen, sustancias qumicas y casi cualquiera de las cosas que lo rodean en su vida diaria que producen alrgenos. Un alrgeno es todo lo que hace que una sustancia produzca alergia. La herencia es uno de los factores que causa este problema. Esto significa que usted puede sufrir alguna de las alergias que sufrieron sus [place]. Las [employer]tronic a la comida pueden ocurrir a Hotel manager. Estn entre las ms graves y Haematologist en peligro la vida. Algunos de los alimentos que comnmente producen [place] son la [place], los frutos de mar, los [place], los frutos secos, el trigo y la soja. SNTOMAS  Hinchazn alrededor de la boca.  Una erupcin roja que produce picazn o urticaria.  Vmitos o diarrea.  Dificultad para respirar. LAS REACCIONES ALRGICAS GRAVES PONEN EN PELIGRO LA VIDA . Esta reaccin se denomina anafilaxis. Puede ocasionar que la boca y la garganta se hinchen y produzca dificultad para respirar y Chartered loss adjuster. En reacciones graves, slo una pequea cantidad del alimento (por ejemplo, aceite de cacahuate en la ensalada) puede producir la muerte en pocos segundos. Las [employer] pueden ocurrir a Hotel manager. Se denominan as porque generalmente se producen durante la misma estacin todos los aos. Puede ser Ardelia Mems reaccin al moho, al polen del csped o al polen de los rboles. Otras causas del problema son los alrgenos que contienen los caros del polvo del hogar, el pelaje de las mascotas y las esporas del moho. Los sntomas consisten en congestin nasal, picazn y secrecin  nasal asociada con estornudos, y lagrimeo y [employer] ojos. Tambin puede haber picazn de la boca y los odos. Estos problemas aparecen cuando se entra en contacto con el polen y otros alrgenos. Los alrgenos son las partculas que estn en el aire y a las que el organismo reacciona cuando existe una Risk analyst. Esto hace que usted libere anticuerpos alrgicos. A travs de una cadena de eventos, estos finalmente hacen que usted libere histamina en la corriente sangunea. Aunque esto implica una proteccin para su organismo, es lo que le produce disconfort. Ese es el motivo por el que se le han indicado antihistamnicos para sentirse mejor. Si usted no Lexicographer cul es el alrgeno que le produjo la reaccin, puede someterse a una prueba de [place] o de piel. Las alergias no pueden curarse pero pueden controlarse con medicamentos. La fiebre de heno es un grupo de trastornos alrgicos estacionales Simplemente se tratan con medicamentos de venta libre como difenhidramina (Benadryl). Tome los medicamentos segn las indicaciones. No consuma alcohol ni conduzca mientras toma este medicamento. Consulte con el profesional que lo asiste o siga las instrucciones de uso para las dosis para nios. Si estos medicamentos no le Training and development officer, existen muchos otros nuevos que el profesional que lo asiste puede prescribirle. Podrn utilizarse medicamentos ms fuertes tales como un spray nasal, colirios y corticoides si los primeros medicamentos que prueba no lo [place]. Si todos estos fracasan, puede Risk manager otros tratamientos como la inmunoterapia o las inyecciones desensibilizantes. Haga una consulta de seguimiento con el profesional que lo asiste si los problemas continan. Estas alergias estacionales  no ponen en peligro la vida. Generalmente se trata de una incomodidad que puede aliviarse con medicamentos. INSTRUCCIONES PARA EL CUIDADO DOMICILIARIO  Si no est seguro de que es lo que le produce la  reaccin, [place]ar[place] manager un registro de los alimentos que come y los sntomas que le siguen. Evite los Nurse, mental health.  Si presenta urticaria o una erupcin cutnea:  Tome los medicamentos como se le indic.  Puede utilizar un antihistamnico de venta libre (difenhidramina) para la urticaria y Cabin crew, segn sea necesario.  Aplquese compresas sobre la piel o tome baos de agua fra. Evite los [place] calientes. El calor puede hacer que la urticaria y la picazn empeoren.  Si usted es muy alrgico:  Como consecuencia de un tratamiento para una reaccin grave, puede necesitar ser hospitalizado para recibir un seguimiento intensivo.  Utilice un brazalete o collar de alerta mdico, indicando que usted es Air cabin crew.  Usted y su familia deben aprender a Architectural technologist adrenalina o a Risk manager un kit anafilctico.  Si usted ya ha sufrido una reaccin grave, siempre lleve el kit anafilctico o el EpiPen con usted. Si sufre una reaccin grave, utilice esta medicacin del modo en que se lo indic el profesional que lo asiste. Una falla puede conllevar consecuencias fatales. SOLICITE ATENCIN MDICA SI:  Sospecha que puede sufrir una alergia a algn alimento. Los sntomas generalmente ocurren dentro de los 30 minutos posteriores a haber ingerido el alimento.  Los sntomas persistieron durante 2 das o han empeorado.  Desarrolla nuevos sntomas.  Quiere volver a probar o que su hijo consuma nuevamente un alimento o bebida que usted cree que le causa una reaccin IT consultant. Nunca lo haga si ha sufrido una reaccin anafilctica a ese alimento o a esa bebida con anterioridad. Slo intntelo bajo la supervisin del mdico. SOLICITE ATENCIN MDICA DE INMEDIATO SI:  Presenta dificultad para respirar, jadea o tiene una sensacin de opresin en el pecho o en la garganta.  Tiene la boca hinchada, o presenta urticaria, hinchazn o picazn en todo el cuerpo.  Ha sufrido una  reaccin grave que ha respondido a Radiation protection practitioner o al EpiPen. Estas reacciones pueden volver a presentarse cuando haya terminado la medicacin. Estas reacciones deben considerarse como que ponen en peligro la vida. EST SEGURO QUE:   Comprende las instrucciones para el alta mdica.  Controlar su enfermedad.  Solicitar atencin mdica de inmediato segn las indicaciones. Document Released: 05/05/2005 Document Revised: 08/30/2012 [facility] Patient Information 2015 [place], [place]. This information is not intended to replace advice given to you by your health care provider. Make sure you discuss any questions you have with your health care provider.

## 2014-10-07 NOTE — ED Provider Notes (Signed)
Tiffany Vazquez is a 44 y.o. female who presents to Urgent Care today for Rash. Patient notes a pruritic macular rash present on her face and chest for the last 10 days. She thinks the rash started after she was exposed to strep mangoes or peppers. Eyes any fevers or chills nausea vomiting or diarrhea. She denies any trouble breathing lip or tongue swelling. She has tried Claritin and hydrocortisone cream which have not helped very much. She feels well otherwise. She has a history of rosacea but notes that her rash is not similar to previous episodes of rosacea.   Past Medical History  Diagnosis Date  . Bronchitis   . Rosacea    Past Surgical History  Procedure Laterality Date  . Cesarean section     History  Substance Use Topics  . Smoking status: Never Smoker   . Smokeless tobacco: Not on file  . Alcohol Use: No   ROS as above Medications: No current facility-administered medications for this encounter.   Current Outpatient Prescriptions  Medication Sig Dispense Refill  . loratadine (CLARITIN) 10 MG tablet Take 1 tablet (10 mg total) by mouth daily. 30 tablet 0  . predniSONE (STERAPRED UNI-PAK 48 TAB) 5 MG (48) TBPK tablet Take 1 tablet (5 mg total) by mouth daily. 12 day dosepack po. Spanish instructions 48 tablet 0  . [DISCONTINUED] albuterol (PROVENTIL HFA;VENTOLIN HFA) 108 (90 BASE) MCG/ACT inhaler Inhale 2 puffs into the lungs every 6 (six) hours as needed for wheezing. 1 Inhaler 3  . [DISCONTINUED] cetirizine (ZYRTEC) 10 MG tablet Take 1 tablet (10 mg total) by mouth daily. One tab daily for allergies 30 tablet 1   Allergies  Allergen Reactions  . Other     Caffeine & spicy foods flare up Rosacea     Exam:  BP 124/70 mmHg  Pulse 70  Temp(Src) 98.8 F (37.1 C) (Oral)  Resp 18  SpO2 99%  LMP 09/21/2014 (Exact Date) Gen: Well NAD HEENT: EOMI,  MMM no tongue or lip swelling Lungs: Normal work of breathing. CTABL Heart: RRR no MRG Abd: NABS, Soft.  Nondistended, Nontender Exts: Brisk capillary refill, warm and well perfused.  Skin: Macular blanching erythematous rash on face and neck and chest.  No results found for this or any previous visit (from the past 24 hour(s)). No results found.  Assessment and Plan: 44 y.o. female with rash likely due to food allergies. Treat with prednisone dose pack and Benadryl. Use [place] Itch for temporary control of itching. Refer to allergy/immunology for allergy testing for food allergies.  Discussed warning signs or symptoms. Please see discharge instructions. Patient expresses understanding.     Gregor Hams, MD 10/07/14 1017

## 2014-10-21 ENCOUNTER — Emergency Department (HOSPITAL_COMMUNITY)
Admission: EM | Admit: 2014-10-21 | Discharge: 2014-10-21 | Disposition: A | Discharge status: Home / Self Care | Payer: No Typology Code available for payment source | Source: Home / Self Care | Attending: Family Medicine | Admitting: Family Medicine

## 2014-10-21 ENCOUNTER — Encounter (HOSPITAL_COMMUNITY): Payer: Self-pay | Admitting: Emergency Medicine

## 2014-10-21 DIAGNOSIS — L719 Rosacea, unspecified: Secondary | ICD-10-CM | POA: Diagnosis present

## 2014-10-21 HISTORY — DX: Rosacea, unspecified: L71.9

## 2014-10-21 MED ORDER — METRONIDAZOLE 1 % EX GEL: Freq: Every day | CUTANEOUS | Status: DC

## 2014-10-21 NOTE — ED Notes (Signed)
C/o  Rash on face for about a month.  Pt was seen here on 5/21. States I took all meds but still not having any relief.   C/o  Severe irritation, mild swelling, burning sensation.   Pt has tried benadryl cream and benadryl tabs with no relief.

## 2014-10-21 NOTE — ED Provider Notes (Signed)
Tiffany Vazquez is a 44 y.o. female who presents to Urgent Care today for burning irritated erythema on her face present for the last month or so. She was seen in May for the same problem but to be related to food allergies. She was given oral prednisone which did not help at all. She's been using some Cetaphil moisturizer as well as some face sunscreen which have helped a little. She notes a burning irritated area on her face bilaterally. No fevers or chills nausea vomiting or diarrhea. No new soaps detergents or shampoos.   Past Medical History  Diagnosis Date  . Bronchitis   . Rosacea    Past Surgical History  Procedure Laterality Date  . Cesarean section     History  Substance Use Topics  . Smoking status: Never Smoker   . Smokeless tobacco: Not on file  . Alcohol Use: No   ROS as above Medications: No current facility-administered medications for this encounter.   Current Outpatient Prescriptions  Medication Sig Dispense Refill  . loratadine (CLARITIN) 10 MG tablet Take 1 tablet (10 mg total) by mouth daily. 30 tablet 0  . metroNIDAZOLE (METROGEL) 1 % gel Apply topically daily. 60 g 1  . predniSONE (STERAPRED UNI-PAK 48 TAB) 5 MG (48) TBPK tablet Take 1 tablet (5 mg total) by mouth daily. 12 day dosepack po. Spanish instructions 48 tablet 0  . [DISCONTINUED] albuterol (PROVENTIL HFA;VENTOLIN HFA) 108 (90 BASE) MCG/ACT inhaler Inhale 2 puffs into the lungs every 6 (six) hours as needed for wheezing. 1 Inhaler 3  . [DISCONTINUED] cetirizine (ZYRTEC) 10 MG tablet Take 1 tablet (10 mg total) by mouth daily. One tab daily for allergies 30 tablet 1   Allergies  Allergen Reactions  . Other     Caffeine & spicy foods flare up Rosacea     Exam:  BP 147/86 mmHg  Pulse 66  Temp(Src) 97 F (36.1 C) (Oral)  Resp 14  SpO2 99%  LMP 09/21/2014 (Exact Date) Gen: Well NAD HEENT: EOMI,  MMM Lungs: Normal work of breathing. CTABL Heart: RRR no MRG Abd: NABS, Soft.  Nondistended, Nontender Exts: Brisk capillary refill, warm and well perfused.  Skin face: Erythematous maculopapular malar rash No results found for this or any previous visit (from the past 24 hour(s)). No results found.  Assessment and Plan: 44 y.o. female with rosacea. Treatment with metronidazole gel. Follow-up with PCP.  Discussed warning signs or symptoms. Please see discharge instructions. Patient expresses understanding.     Gregor Hams, MD 10/21/14 [phone]

## 2014-10-21 NOTE — Discharge Instructions (Signed)
Thank you for coming in today.  Roscea  (Rosacea)  La roscea es una enfermedad crnica que afecta la piel de la cara ([place], nariz, frente y [place]) y en algunos casos, los ojos. La roscea hace que los vasos sanguneos que se encuentran cerca de la superficie de la piel se agranden y causen enrojecimiento. Esta afeccin generalmente comienza despus de los 30 aos. Se presenta con mayor frecuencia en mujeres de piel clara. Si no se trata, tiende a Engineering geologist. No hay cura para la roscea, pero el tratamiento puede ayudar a [employer] sntomas.  CAUSAS  La causa es desconocida. Se cree que algunas personas heredan la tendencia a Actor roscea. Algunos factores desencadenantes pueden hacer que empeore, por ejemplo:   Los baos calientes.  La actividad fsica.  La luz del sol.  Las temperaturas muy clidas o fras.  Los alimentos y las bebidas muy calientes o condimentadas.  El consumo de alcohol.  El estrs.  Los medicamentos para la presin arterial.  El uso de corticoides tpicos en el rostro por un tiempo prolongado. SNTOMAS   Enrojecimiento del rostro.  Bultos o granos rojos en la cara.  Carlis Stable, agrandada (rinofima).  Se ruboriza fcilmente.  Lneas rojas en la piel.  Irritacin o sensacin de ardor en los ojos.  Prpados hinchados. DIAGNSTICO  El mdico podr hacer un diagnstico preguntndole los sntomas y haciendo un examen fsico.  Youth worker lo que la ocasiona es una parte importante del [place]. Tambin tendr que ver a un especialista de la piel (dermatlogo) para que realice un plan de tratamiento para usted. Los [employer] del tratamiento son Aeronautical engineer enfermedad y Teacher, English as a foreign language la apariencia de su piel. Puede llevar varias semanas o meses de tratamiento antes de que note una mejora en la piel. An despus de que su piel mejore, es probable que necesite continuar con el tratamiento para evitar que la roscea vuelva a  Arts administrator. Los mtodos de tratamiento pueden ser:   Uso de Firefighter o pantalla solar todos los das para proteger la piel.  Antibiticos, como el metronidazol, aplicado directamente [employer].  Antibiticos por va oral. Se recetan en caso de que tenga problemas en los ojos debido a la roscea.  Ciruga con lser para mejorar la apariencia de la piel. Esta ciruga puede reducir la aparicin de lneas rojas en la piel y eliminar el exceso de tejido de la nariz para reducir su tamao. INSTRUCCIONES PARA EL CUIDADO EN EL HOGAR   Evite lo que parece causar los brotes.  Si le han recetado antibiticos, tmelos segn las indicaciones. Tmelos todos, aunque se sienta mejor.  Use un limpiador facial suave que no contenga alcohol.  Puede utilizar un humectante facial suave.  Use pantalla solar con SPF 30  ms.  Si es necesario, use una base en polvo con tinte verde para ocultar el enrojecimiento. Elija cosmticos no comedognicos. Esto significa que no obstruyen los poros.  Si tiene [employer] prpados, aplique un pao tibio. Cambie el vendaje 4 veces por da o como le aconsej su mdico. SOLICITE ATENCIN MDICA SI:   Los problemas de la piel empeoran.  Se siente deprimido.  Pierde el apetito.  Tiene dificultad para respirar.  Tiene problemas en los ojos, como enrojecimiento o picazn. ASEGRESE DE QUE:   Comprende estas instrucciones.  Controlar su enfermedad.  Solicitar ayuda de inmediato si no mejora o si empeora. Document Released: 05/05/2005 Document Revised: 11/04/2011 [facility] Patient Information 2015 [place], [place]. This  information is not intended to replace advice given to you by your health care provider. Make sure you discuss any questions you have with your health care provider. ° °

## 2014-12-25 ENCOUNTER — Other Ambulatory Visit (INDEPENDENT_AMBULATORY_CARE_PROVIDER_SITE_OTHER): Payer: No Typology Code available for payment source

## 2014-12-25 ENCOUNTER — Ambulatory Visit (INDEPENDENT_AMBULATORY_CARE_PROVIDER_SITE_OTHER): Payer: No Typology Code available for payment source | Admitting: Internal Medicine

## 2014-12-25 ENCOUNTER — Encounter: Payer: Self-pay | Admitting: Internal Medicine

## 2014-12-25 VITALS — BP 112/74 | HR 65 | Ht 65.0 in | Wt 181.6 lb

## 2014-12-25 DIAGNOSIS — L299 Pruritus, unspecified: Secondary | ICD-10-CM

## 2014-12-25 DIAGNOSIS — T7840XA Allergy, unspecified, initial encounter: Secondary | ICD-10-CM | POA: Insufficient documentation

## 2014-12-25 DIAGNOSIS — R21 Rash and other nonspecific skin eruption: Secondary | ICD-10-CM

## 2014-12-25 DIAGNOSIS — Z91018 Allergy to other foods: Secondary | ICD-10-CM

## 2014-12-25 LAB — SEDIMENTATION RATE: SED RATE: 32 mm/h — AB (ref 0–22)

## 2014-12-25 LAB — C-REACTIVE PROTEIN: CRP: 1 mg/dL (ref 0.5–20.0)

## 2014-12-25 NOTE — Progress Notes (Signed)
12/25/14- Referred by Dr Cory/ ER. Pt c/o allergic reaction to eating a mango 2 months ago. Pt states that since then face has been broken out in hives with dryness and small rash over the rest of body. Pt states she also has allerrgy to shellfish.  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 She tells me she's been having skin problems for 7 years. She does take a topical ointment for treatment of rosacea on her face. Her main complaint is that of itching with very little rash, mostly on the neck and sun exposed arms and legs. Daily symptoms. She thought it might be a food allergy and has recognized exacerbation with shrimp which she avoids. Otherwise she suspects shellfish, mango, spicy foods, dairy and egg but without clear association She think she is worse under fluorescent lights and maybe with exposure to cleaning chemicals on her hands. She has been using fabric softener dryer sheets. Uses a daily sunblocker and has not changed home detergents body washes soaps or shampoos. She denies asthma or serious/anaphylactic reaction.  Prior to Admission medications   Medication Sig Start Date End Date Taking? Authorizing Provider  loratadine (CLARITIN) 10 MG tablet Take 1 tablet (10 mg total) by mouth daily. 08/28/14  Yes Melony Overly, MD  metroNIDAZOLE (METROGEL) 1 % gel Apply topically daily. 10/21/14  Yes Gregor Hams, MD  predniSONE (STERAPRED UNI-PAK 48 TAB) 5 MG (48) TBPK tablet Take 1 tablet (5 mg total) by mouth daily. 12 day dosepack po. Spanish instructions Patient not taking: Reported on 12/25/2014 10/07/14   Gregor Hams, MD   Past Medical History  Diagnosis Date  . Bronchitis   . Rosacea    Past Surgical History  Procedure Laterality Date  . Cesarean section     Family History  Problem Relation Age of Onset  . Diabetes Mother   . Diabetes Father   . Allergic Disorder Mother    History   Social History  . Marital Status:  Married    Spouse Name: N/A  . Number of Children: N/A  . Years of Education: N/A   Occupational History  . Not on file.   Social History Main Topics  . Smoking status: Never Smoker   . Smokeless tobacco: Not on file  . Alcohol Use: No  . Drug Use: No  . Sexual Activity: Not on file   Other Topics Concern  . Not on file   Social History Narrative   ROS-see HPI   Negative unless "+" Constitutional:    weight loss, night sweats, fevers, chills, fatigue, lassitude. HEENT:    headaches, difficulty swallowing, tooth/dental problems, sore throat,       Sneezing,+ itching, ear ache, +nasal congestion, post nasal drip, snoring CV:    chest pain, orthopnea, PND, swelling in lower extremities, anasarca,                                                    dizziness, palpitations Resp:   shortness of breath with exertion or at rest.                productive cough,   non-productive cough, coughing up of blood.              change in color of mucus.  wheezing.   Skin:    +rash or lesions. GI: + heartburn, indigestion, abdominal pain, nausea, vomiting, diarrhea,                 change in bowel habits, loss of appetite GU: dysuria, change in color of urine, no urgency or frequency.   flank pain. MS:   joint pain, stiffness, decreased range of motion, back pain. Neuro-     nothing unusual Psych:  change in mood or affect.  depression or anxiety.   memory loss.  OBJ- Physical Exam General- Alert, Oriented, Affect-appropriate, Distress- none acute Skin- + faintly reddish skin on face. Slightly oily sunblocker on exposed skin. Lymphadenopathy- none Head- atraumatic            Eyes- Gross vision intact, PERRLA, conjunctivae and secretions clear            Ears- Hearing, canals-normal            Nose- Clear, no-Septal dev, mucus, polyps, erosion, perforation             Throat- Mallampati II , mucosa clear , drainage- none, tonsils- atrophic Neck- flexible , trachea midline, no stridor ,  thyroid nl, carotid no bruit Chest - symmetrical excursion , unlabored           Heart/CV- RRR , no murmur , no gallop  , no rub, nl s1 s2                           - JVD- none , edema- none, stasis changes- none, varices- none           Lung- clear to P&A, wheeze- none, cough- none , dullness-none, rub- none           Chest wall-  Abd-  Br/ Gen/ Rectal- Not done, not indicated Extrem- cyanosis- none, clubbing, none, atrophy- none, strength- nl Neuro- grossly intact to observation

## 2014-12-25 NOTE — Assessment & Plan Note (Signed)
She describes generalized itching with minimal rash. Exposures are very uncertain. She has been using fabric softener dryer sheet in this kind of product often contains bacterial enzymes which can be sensitizing. Plan-change laundry products as discussed. Lab for allergy profile, food IgE, C-reactive protein, sedimentation rate. Avoid heat foods such as shrimp which have been specifically recognizes aggravating symptoms.

## 2014-12-25 NOTE — Patient Instructions (Addendum)
Order- lab- Allergy profile, Food allergy profile, C-reactive protein, Sed rate, ANA,                    Seafood allergy panel                   Mango IgE   Try using Marylyn Ishihara as your detergent to wash clothes                 Downy liquid fabric softener   You can try otc antihistamine fexofenadine/ Allegra  To see if it works better than loratadine/ Claritin

## 2014-12-26 LAB — ALLERGEN FOOD PROFILE SPECIFIC IGE
Apple: <0.1 kU/L
Egg White IgE: <0.1 kU/L
Fish Cod: <0.1 kU/L
IgE (Immunoglobulin E), Serum: 101 kU/L (ref <115)
Orange: <0.1 kU/L
Peanut IgE: <0.1 kU/L
Shrimp IgE: <0.1 kU/L
Soybean IgE: <0.1 kU/L
Tuna IgE: <0.1 kU/L
Wheat IgE: <0.1 kU/L

## 2014-12-26 LAB — ALLERGY FULL PROFILE
ALLERGEN, D PTERNOYSSINUS, D1: 0.23 kU/L — AB
Allergen,Goose feathers, e70: <0.1 kU/L
Alternaria Alternata: <0.1 kU/L
Aspergillus fumigatus, m3: <0.1 kU/L
BOX ELDER: 0.54 kU/L — AB
Bermuda Grass: <0.1 kU/L
CAT DANDER: 6.78 kU/L — AB
Candida Albicans: <0.1 kU/L
Curvularia lunata: <0.1 kU/L
D. farinae: <0.1 kU/L
Dog Dander: 0.45 kU/L — ABNORMAL HIGH
Fescue: <0.1 kU/L
G005 Rye, Perennial: <0.1 kU/L
HOUSE DUST HOLLISTER: 0.93 kU/L — AB
Lamb's Quarters: <0.1 kU/L
Oak: <0.1 kU/L
Plantain: <0.1 kU/L
SYCAMORE TREE: 0.28 kU/L — AB
Stemphylium Botryosum: <0.1 kU/L
Timothy Grass: <0.1 kU/L

## 2014-12-26 LAB — ALLERGEN SEAFOOD PANEL
Clams: <0.1 kU/L
Crayfish: <0.1 kU/L
Lobster: <0.1 kU/L
Oyster: <0.1 kU/L
Scallop IgE: <0.1 kU/L
Tuna IgE: <0.1 kU/L

## 2014-12-26 LAB — ANA: Anti Nuclear Antibody(ANA): NEGATIVE

## 2014-12-27 LAB — ALLERGEN, MANGO, F91: Mango kU/L: <0.1

## 2015-04-16 ENCOUNTER — Ambulatory Visit: Payer: No Typology Code available for payment source | Admitting: Internal Medicine

## 2015-06-11 ENCOUNTER — Emergency Department (HOSPITAL_COMMUNITY)
Admission: EM | Admit: 2015-06-11 | Discharge: 2015-06-11 | Disposition: A | Discharge status: Home / Self Care | Payer: Worker's Compensation | Attending: Emergency Medicine | Admitting: Emergency Medicine

## 2015-06-11 ENCOUNTER — Encounter (HOSPITAL_COMMUNITY): Payer: Self-pay | Admitting: Emergency Medicine

## 2015-06-11 DIAGNOSIS — S61232A Puncture wound without foreign body of right middle finger without damage to nail, initial encounter: Secondary | ICD-10-CM | POA: Insufficient documentation

## 2015-06-11 DIAGNOSIS — Y990 Civilian activity done for income or pay: Secondary | ICD-10-CM | POA: Diagnosis not present

## 2015-06-11 DIAGNOSIS — Z872 Personal history of diseases of the skin and subcutaneous tissue: Secondary | ICD-10-CM | POA: Insufficient documentation

## 2015-06-11 DIAGNOSIS — Z7721 Contact with and (suspected) exposure to potentially hazardous body fluids: Secondary | ICD-10-CM | POA: Diagnosis not present

## 2015-06-11 DIAGNOSIS — Y93E5 Activity, floor mopping and cleaning: Secondary | ICD-10-CM | POA: Insufficient documentation

## 2015-06-11 DIAGNOSIS — Z79899 Other long term (current) drug therapy: Secondary | ICD-10-CM | POA: Insufficient documentation

## 2015-06-11 DIAGNOSIS — W461XXA Contact with contaminated hypodermic needle, initial encounter: Secondary | ICD-10-CM | POA: Insufficient documentation

## 2015-06-11 DIAGNOSIS — Y9289 Other specified places as the place of occurrence of the external cause: Secondary | ICD-10-CM | POA: Diagnosis not present

## 2015-06-11 DIAGNOSIS — Z8709 Personal history of other diseases of the respiratory system: Secondary | ICD-10-CM | POA: Insufficient documentation

## 2015-06-11 DIAGNOSIS — Z792 Long term (current) use of antibiotics: Secondary | ICD-10-CM | POA: Diagnosis not present

## 2015-06-11 LAB — RAPID HIV SCREEN (HIV 1/2 AB+AG)
HIV 1/2 Antibodies: NONREACTIVE
HIV-1 P24 Antigen - HIV24: NONREACTIVE

## 2015-06-11 MED ORDER — RALTEGRAVIR POTASSIUM 400 MG PO TABS: 400.0000 mg | ORAL_TABLET | Freq: Two times a day (BID) | ORAL | Status: DC

## 2015-06-11 MED ORDER — EMTRICITABINE-TENOFOVIR DF 200-300 MG PO TABS: 1.0000 | ORAL_TABLET | Freq: Every day | ORAL | Status: DC

## 2015-06-11 MED ORDER — EMTRICITABINE-TENOFOVIR AF 200-25 MG PO TABS: 1.0000 | ORAL_TABLET | Freq: Every day | ORAL | Status: DC

## 2015-06-11 NOTE — Discharge Instructions (Signed)
YOU WILL NEED TO SEE YOUR DOCTOR IN 6 WEEKS FOR REPEAT BLOOD TESTING TO INSURE THERE WAS NO INFECTION FROM NEEDLE STICK EXPOSURE TODAY.   Needle Stick Injury A needle stick injury occurs when you are stuck by a needle that may have the blood from another person on it. Most of the time these injuries heal without any problem, but several diseases can be transmitted this way. You should be aware of the risks. A needle stick injury can cause risk for getting:  Hepatitis B.  Hepatitis C.  HIV infection (the virus that causes AIDS). The chance of getting one of these infections from a needle stick injury is small. However, it is important to take proper precautions to prevent such an injury. It is also important to understand and follow some health care recommendations when such an injury occurs.  RISK FACTORS In general, the risk of infection after a needle stick injury appears to be higher with:  Exposure to a needle that is visibly contaminated with blood.  Exposure to the blood of a patient with an advanced disease or with a high viral load.  A deep injury.  Needle placement in a vein or artery. PREVENTION  All health care workers should:  Wash their hands often, including before and after caring for each patient.  Receive the hepatitis B vaccine before any possible exposure to blood or bloody body fluids.  Use personal protective equipment (PPE) when appropriate. This includes:  Gloves.  Gowns.  Boots.  Shoe covers.  Eyewear.  Masks.  Wear gloves when any kind of venous or arterial access is being done.  Use safety devices when available.  Use sharp edges and needles with caution.  Dispose of used needles and other sharps in puncture proof receptacles.  Never recap needles. TREATMENT   After a needle stick injury, immediate cleansing with soap and water or an alcohol-based hand hygiene agent is needed.  If you did not have a tetanus booster within the past 10  years, a booster shot should be given.  If the puncture site becomes red, swollen, more painful, or drains yellowish-white fluid (pus), medicine for a bacterial infection may be needed.  If the blood on the needle is known or thought to be high risk for hepatitis B or HIV, additional treatment is needed.  For needle stick exposures to the HIV virus, drug treatment is advised. This treatment is called post-exposure prophylaxis (PEP) and should be started as soon as possible following the injury. The recommended period of treatment with medicines is usually 4 weeks with 2 or more different drugs. You should have follow-up counseling and a medical evaluation, including HIV blood tests, right away. The tests should be repeated at 6 weeks, 3 months, and 6 months. Blood tests to monitor for drug toxicity effects of the PEP medicines are usually recommended immediately before treatment starts and again at 2 weeks and 4 weeks after the start of PEP. Additional recommendations during the first 6 to 12 weeks after exposure include:  Practicing sexual abstinence or using condoms to prevent sexual transmission and to avoid pregnancy.  Refraining from donating blood, plasma, semen, organs, or other tissue.  For breastfeeding women, considering temporary discontinuation of breastfeeding while on PEP.  For needle stick exposures to hepatitis B, blood testing and PEP is also needed. If you have not been vaccinated against hepatitis B, this vaccine series should be started and hepatitis B immune globulin should also be given. If you have been previously  vaccinated, your status of immunity to infection with hepatitis B can be tested by a blood antibody test. Before or after those test results are available, repeat vaccination with or without hepatitis B immune globulin will be considered.  Unfortunately, no helpful treatment following hepatitis C exposure has been identified or is recommended. Follow-up blood testing  is advised over a period of 4 weeks to 6 months to determine if the needle stick led to an infection. Ask your caregiver for advice about this follow-up testing. HOME CARE INSTRUCTIONS  Take medicine exactly as told by your caregiver.  Keep all follow-up appointments.  Do not share personal hygiene items. SEEK IMMEDIATE MEDICAL CARE IF:  You have concerns about your injury, treatment, or follow-up.  The injury site becomes red, swollen, or painful.  The injury site drains pus. MAKE SURE YOU:  Understand these instructions.  Will watch your condition.  Will get help right away if you are not doing well or get worse.   This information is not intended to replace advice given to you by your health care provider. Make sure you discuss any questions you have with your health care provider.   Document Released: 05/05/2005 Document Revised: 05/26/2014 Document Reviewed: 11/22/2014 Elsevier Interactive Patient Education [employer].

## 2015-06-11 NOTE — ED Notes (Signed)
Patient reports she was at work cleaning and was stuck by a needle she thinks is used to check blood sugar. Patient reports she was wearing gloves, no bleeding noted to right middle finger, very small puncture wound noted. Denies other c/c.

## 2015-06-11 NOTE — ED Provider Notes (Signed)
CSN: [id number]     Arrival date & time 06/11/15  2135 History  By signing my name below, I, [name], attest that this documentation has been prepared under the direction and in the presence of Charlann Lange, PA-C Electronically Signed: [name], ED Scribe. 06/11/2015. 10:25 PM.  Chief Complaint  Patient presents with  . Body Fluid Exposure   The history is provided by the patient. No language interpreter was used.  HPI Comments: Tiffany Vazquez is a 45 y.o. female who presents to the Emergency Department complaining of body fluid exposure onset an hour ago. Pt states that she was cleaning at work and stuck by a needle she believes was used to check someone's blood sugar. She states that she was wearing gloves at the time, but sustained a small puncture wound to the right middle finger. Pt was directed to the ED by her boss for evaluation. She denies fever, chills, nausea, vomiting, or rash. She denies hx of diabetes or significant medical hx.    Past Medical History  Diagnosis Date  . Bronchitis   . Rosacea    Past Surgical History  Procedure Laterality Date  . Cesarean section     Family History  Problem Relation Age of Onset  . Diabetes Mother   . Diabetes Father   . Allergic Disorder Mother    Social History  Substance Use Topics  . Smoking status: Never Smoker   . Smokeless tobacco: None  . Alcohol Use: No   OB History    Gravida Para Term Preterm AB TAB SAB Ectopic Multiple Living   5 3 2 1 2  2   3      Review of Systems  Constitutional: Negative for fever and chills.  Gastrointestinal: Negative for nausea and vomiting.  Skin: Positive for wound. Negative for rash.   Allergies  Other  Home Medications   Prior to Admission medications   Medication Sig Start Date End Date Taking? Authorizing Provider  loratadine (CLARITIN) 10 MG tablet Take 1 tablet (10 mg total) by mouth daily. 08/28/14   Melony Overly, MD  metroNIDAZOLE (METROGEL) 1 % gel  Apply topically daily. 10/21/14   Gregor Hams, MD  predniSONE (STERAPRED UNI-PAK 48 TAB) 5 MG (48) TBPK tablet Take 1 tablet (5 mg total) by mouth daily. 12 day dosepack po. Spanish instructions Patient not taking: Reported on 12/25/2014 10/07/14   Gregor Hams, MD   BP 129/51 mmHg  Pulse 101  Temp(Src) 98.4 F (36.9 C) (Oral)  Resp 20  SpO2 100%  LMP 05/28/2015 Physical Exam  Constitutional: She is oriented to person, place, and time. She appears well-developed and well-nourished.  HENT:  Head: Normocephalic and atraumatic.  Eyes: EOM are normal.  Neck: Normal range of motion. Neck supple.  Cardiovascular: Normal rate.   Pulmonary/Chest: Effort normal.  Musculoskeletal: Normal range of motion.  Neurological: She is alert and oriented to person, place, and time.  Skin: Skin is warm and dry.  No redness of right distal middle finger. No swelling or tenderness.   Psychiatric: She has a normal mood and affect. Her behavior is normal.  Nursing note and vitals reviewed.   ED Course  Procedures (including critical care time) DIAGNOSTIC STUDIES: Oxygen Saturation is 100% on RA, normal by my interpretation.    COORDINATION OF CARE: 10:23 PM-Discussed treatment plan with pt at bedside and pt agreed to plan.    Labs Review Labs Reviewed - No data to display  Imaging Review  No results found. I have personally reviewed and evaluated these images and lab results as part of my medical decision-making.   EKG Interpretation None      MDM   Final diagnoses:  None    1. Needle stick exposure to body fluids  Discussed with ID who advise protocol testing and offer to patient of 28-day prophylaxis with Truvada and Isentress. The patient will take the medications. Baseline labs drawn. She is instructed to go for repeat testing in 6 weeks.    I personally performed the services described in this documentation, which was scribed in my presence. The recorded information has been reviewed  and is accurate.     Charlann Lange, PA-C 06/11/15 2255  Leo Grosser, MD 06/13/15 [phone]

## 2015-06-12 ENCOUNTER — Telehealth: Payer: Self-pay | Admitting: *Deleted

## 2015-06-12 MED ORDER — LAMIVUDINE-ZIDOVUDINE 150-300 MG PO TABS: 1.0000 | ORAL_TABLET | Freq: Two times a day (BID) | ORAL | Status: DC

## 2015-06-12 MED ORDER — ELVITEG-COBIC-EMTRICIT-TENOFDF 150-150-200-300 MG PO TABS: 1.0000 | ORAL_TABLET | Freq: Every day | ORAL | Status: DC

## 2015-06-12 NOTE — Progress Notes (Addendum)
Pt returned to [facility] ED after finding cost of prescriptions to be [id number] [phone] [place] and $1347.02 insentress)  Pt is uninsured and can not afford this cost for prophylatic HIV medications ED CMs x 2 spoke with ED PA Elmyra Ricks and EDP Knott  Alternative medication Stribild discussed ED CM spoke with Carlie at [facility] on [address] to confirm Stribild is an alternative to [place] and insentress Updated EDP & ED PA  RX written in EPIC for  Stribild 1 tab po with breakfast qd 30 tabs, no refills Pt updated by CM about Stribild and attempt to decrease cost CM spoke with pt to request she speak with her employer about work compensation  Pt has to go to work and is unable to remain in [employer] ED lobby while awaiting response from [employer] pt assistance program Pt left her contact number as [phone] to be reached Pt agreed to all and any assistance to contact [place] on her behalf, to lower cost of medication needed and was appreciative of services rendered.   ED PM CM contacted Stribild pt assistance program, medical necessity letter completed and faxed to company along with pt prescription.   Pending pt assistance response

## 2015-06-12 NOTE — Progress Notes (Addendum)
To whom it may concern:  I am writing on behalf of patient Tiffany Vazquez.  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  She requires prophylaxis with Stribild 150-200-300mg  30 tablets.  Considering the patient's history, I believe it is medically necessary to prescribe Stribild and should be a covered and reimbursed service. Her date of exposure was 06/11/2015.  She will follow up with Infectious Disease in six weeks to ensure no infection has occurred due to needle stick.  Thank you.   This letter of medical necessity was signed by Dr. Leo Grosser.  This letter and prescription was faxed to [facility] to assist with cost of Stribild.    06/12/2015 Tiffany Vazquez RNCM  [facility] spoke to [place] at [place] who requests patient call back for consent to process medication application. Per Joelene Millin, patient will then receive authorization codes etc. To bring with her to the pharmacy to assist with cost.  [facility] called patient at [phone] and explained she needs to call company this evening before 8pm to receive assistance with medication cost.  Patient verbalized understanding and reports she will call [employer] [phone] right now.  No further EDCM needs at this time.  06/12/2015 2102pm  EDCM called and spoke to patient.  She reports she has called [employer] [facility] and everything is complete per patient.  Patient reports "They gave me a lot of numbers."  [facility] informed patient to bring those numbers with her to the [place].  Patient reports medication will be free of charge for her.  EDCM informed patient CM will contact her tomorrow.  Patient thankful for services.  No further EDCm needs at this time.

## 2015-06-12 NOTE — Progress Notes (Signed)
Entered for d/c instructions and to be provided to pt    [place]  Schedule an appointment as soon as possible for a visit on 07/23/2015  Please call to make an appointment in 6 weeks- You were seen on 06/12/15 and 6 weeks from that date is 07/23/15  Go to this center or your doctor of choice on 07/23/15 or by 07/23/15 for further tests  [place] [phone]) [phone]

## 2015-06-13 ENCOUNTER — Telehealth: Payer: Self-pay | Admitting: *Deleted

## 2015-06-13 LAB — HEPATITIS B SURFACE ANTIGEN: Hepatitis B Surface Ag: NEGATIVE

## 2015-06-13 LAB — HEPATITIS C ANTIBODY

## 2015-06-20 ENCOUNTER — Telehealth (HOSPITAL_BASED_OUTPATIENT_CLINIC_OR_DEPARTMENT_OTHER): Payer: Self-pay | Admitting: Emergency Medicine

## 2015-07-17 ENCOUNTER — Emergency Department (INDEPENDENT_AMBULATORY_CARE_PROVIDER_SITE_OTHER)
Admission: EM | Admit: 2015-07-17 | Discharge: 2015-07-17 | Disposition: A | Discharge status: Home / Self Care | Payer: Self-pay | Source: Home / Self Care | Attending: Emergency Medicine | Admitting: Emergency Medicine

## 2015-07-17 ENCOUNTER — Encounter (HOSPITAL_COMMUNITY): Payer: Self-pay | Admitting: Emergency Medicine

## 2015-07-17 DIAGNOSIS — L259 Unspecified contact dermatitis, unspecified cause: Secondary | ICD-10-CM

## 2015-07-17 MED ORDER — PREDNISONE 20 MG PO TABS: ORAL_TABLET | ORAL | Status: DC

## 2015-07-17 NOTE — ED Notes (Signed)
Pt unable to sign due to me not being able to login to [facility]

## 2015-07-17 NOTE — ED Provider Notes (Signed)
CSN: [id number]     Arrival date & time 07/17/15  1300 History   First MD Initiated Contact with Patient 07/17/15 1308     Chief Complaint  Patient presents with  . Poison Ivy   (Consider location/radiation/quality/duration/timing/severity/associated sxs/prior Treatment) HPI Comments: 45 year old female with a papulovesicular rash to the left upper arm and right ear for proximal with 5 days. The area occurs in annular type lesions or crops some areas are still in the streaky fashion and others are isolated papules. The entire area involved is quite small. A portion of the outer right ear, under the lip both eyes and to the left upper arm. It is primarily pruritic. Denies fever, chills or other systemic symptoms. She initially started to apply triamcinolone yesterday noting that it not immediately helped.   Past Medical History  Diagnosis Date  . Bronchitis   . Rosacea    Past Surgical History  Procedure Laterality Date  . Cesarean section     Family History  Problem Relation Age of Onset  . Diabetes Mother   . Diabetes Father   . Allergic Disorder Mother    Social History  Substance Use Topics  . Smoking status: Never Smoker   . Smokeless tobacco: None  . Alcohol Use: No   OB History    Gravida Para Term Preterm AB TAB SAB Ectopic Multiple Living   5 3 2 1 2  2   3      Review of Systems  Constitutional: Negative.   HENT: Negative.   Respiratory: Negative.   Musculoskeletal: Negative.   Skin: Positive for rash.  Psychiatric/Behavioral: Negative.   All other systems reviewed and are negative.   Allergies  Other  Home Medications   Prior to Admission medications   Medication Sig Start Date End Date Taking? Authorizing Provider  elvitegravir-cobicistat-emtricitabine-tenofovir (STRIBILD) 150-150-200-300 MG TABS tablet Take 1 tablet by mouth daily with breakfast. 06/12/15   [place] Lions, PA-C  loratadine (CLARITIN) 10 MG tablet Take 1 tablet (10 mg total) by  mouth daily. 08/28/14   Melony Overly, MD  metroNIDAZOLE (METROGEL) 1 % gel Apply topically daily. 10/21/14   Gregor Hams, MD  predniSONE (DELTASONE) 20 MG tablet 3 Tabs PO Days 1-3, then 2 tabs PO Days 4-6, then 1 tab PO Day 7-9, then Half Tab PO Day 10-12 07/17/15   Janne Napoleon, NP   Meds Ordered and Administered this Visit  Medications - No data to display  BP 124/71 mmHg  Pulse 66  Temp(Src) 98 F (36.7 C) (Oral)  Resp 16  SpO2 97%  LMP 06/26/2015 No data found.   Physical Exam  Constitutional: She appears well-developed and well-nourished. No distress.  HENT:  Left Ear: External ear normal.  Mouth/Throat: No oropharyngeal exudate.  Right outer ear with minor erythema behind the ear at the crease. No other lesions. Oropharynx is clear. EACs are clear bilaterally. TMs are normal.  Eyes: Conjunctivae and EOM are normal.  Neck: Normal range of motion. Neck supple.  Cardiovascular: Normal rate.   Pulmonary/Chest: Effort normal. No respiratory distress.  Lymphadenopathy:    She has no cervical adenopathy.  Neurological: She is alert. She exhibits normal muscle tone.  Skin: Skin is warm and dry. Rash noted.  Papular vesicular rash to the left upper arm with other streaky areas. No signs of bacterial infection. No lymphangitis.  Psychiatric: She has a normal mood and affect.  Nursing note and vitals reviewed.   ED Course  Procedures (including critical  care time)  Labs Review Labs Reviewed - No data to display  Imaging Review No results found.   Visual Acuity Review  Right Eye Distance:   Left Eye Distance:   Bilateral Distance:    Right Eye Near:   Left Eye Near:    Bilateral Near:         MDM   1. Contact dermatitis    Use the triamcinolone cream twice a day as directed. May also use Benadryl cream 30-60 minutes after using the triamcinolone cream. Take the prednisone taper dose with food as directed. Continue taking the Allegra. Cool  compresses.     Janne Napoleon, NP 07/17/15 [phone]

## 2015-07-17 NOTE — ED Notes (Signed)
Reports poss poison ivy rash onset x5 days.... Rash on left arm and now on face Denise fevers, chills... Does not recall exposure to poison ivy plant Has been applying triamcinolone cream w/no relief A&O x4... No acute distress.

## 2015-07-17 NOTE — Discharge Instructions (Signed)
Dermatitis de contacto (Contact Dermatitis) Use the triamcinolone cream twice a day as directed. May also use Benadryl cream 30-60 minutes after using the triamcinolone cream. Take the prednisone taper dose with food as directed. Continue taking the Allegra. Cool compresses. La dermatitis es el enrojecimiento, el dolor y la hinchazn (inflamacin) de la piel. La dermatitis de contacto es una reaccin a ciertas sustancias que entran en contacto con la piel. Hay dos tipos de dermatitis de contacto:   Dermatitis de contacto irritativa. La causa de este tipo de dermatitis es algo que irrita la piel, como las manos secas por lavarlas en exceso. Este tipo no requiere la exposicin previa a la sustancia que caus la reaccin. Este tipo es ms frecuente.  Dermatitis alrgica por contacto. La causa de este tipo de dermatitis es una sustancia a la cual se es Air cabin crew, como una alergia al nquel o a la hiedra venenosa. Este tipo solo ocurre si ha estado expuesto anteriormente a la sustancia (alrgeno). Al repetir la exposicin, el organismo reacciona a la sustancia. Este tipo es menos frecuente. CAUSAS  Muchas sustancias diferentes pueden causar dermatitis de contacto. La causa ms frecuente de la dermatitis de contacto irritativa es la exposicin a lo siguiente:   Maquillaje.   Jabones perfumados.   Detergentes.   Lavandina.   cidos.   Sales metlicas, como el nquel.  Las causas de la dermatitis alrgica son las siguientes:   Plantas venenosas.   Productos qumicos.   Alhajas.   Ltex.   Medicamentos.   Conservantes que se utilizan en determinados productos, como la ropa.  FACTORES DE RIESGO Es ms probable que Personnel officer se manifieste en:   Las personas que tienen trabajos que las exponen a irritantes o a Futures trader.  Las [employer] tienen determinadas enfermedades, por ejemplo, asma o eccema.  SNTOMAS  Los sntomas de esta afeccin pueden presentarse en  cualquier parte del cuerpo con la que usted toque el irritante o donde la sustancia irritante lo haya tocado. Algunos sntomas son los siguientes:  Sequedad o Teacher, music.   Enrojecimiento.   Grietas.   Picazn.   Dolor o sensacin de ardor.   Ampollas.  Secrecin de pequeas cantidades de sangre o de lquido transparente que emanan de las grietas de la piel. En el caso de la dermatitis de Risk manager, puede haber hinchazn solo en algunas partes del cuerpo, como la boca o los genitales.  DIAGNSTICO  Esta afeccin se diagnostica mediante la historia clnica y un examen fsico. Se puede realizar una prueba del parche para ayudar a Office manager causa. Si la afeccin guarda relacin con Leander Rams, tal vez deba consultar a un especialista en medicina ocupacional. TRATAMIENTO El tratamiento de esta afeccin incluye determinar la causa de la reaccin y proteger la piel de nuevos contactos. El tratamiento tambin puede incluir lo siguiente:   Cremas o ungentos con corticoides. En los casos ms graves ser necesario aplicar corticoides por va oral.  Ungentos con antibiticos o antibacterianos, si hay una infeccin en la piel.  Antihistamnicos en forma de locin o por va oral para calmar la picazn.  Un vendaje. INSTRUCCIONES PARA EL CUIDADO EN EL HOGAR Cuidado de la piel  Humctese la piel segn sea necesario.   Aplique compresas fras en las zonas afectadas.  Trate de tomar un bao con lo siguiente:  Sales de [place]. Siga las instrucciones del envase. Puede conseguirlas en la tienda de comestibles o la farmacia local.  Bicarbonato de sodio. Vierta un poco en  la baera como se lo haya indicado el [place] instrucciones del envase. Puede conseguirla en la tienda de comestibles o la farmacia local.  Intente colocarse una pasta de bicarbonato de sodio sobre la piel. Agregue agua al bicarbonato hasta que tenga la consistencia de una  pasta.  No se rasque la piel.  Bese con menos frecuencia, por ejemplo, [employer].  Bese con agua templada. No use agua caliente. [place] o aplquese los medicamentos de venta libre y recetados solamente como se lo haya indicado el mdico.   Si le recetaron un antibitico, tmelo o aplqueselo como se lo haya indicado el mdico. No deje de usar el antibitico aunque la afeccin empiece a Teacher, English as a foreign language. Instrucciones generales  Concurra a todas las visitas de control como se lo haya indicado el mdico. Esto es importante.  Evite la sustancia que ha causado la erupcin. Si no sabe qu la caus, lleve un diario para tratar de identificar la causa. Escriba los siguientes datos:  Lo que come.  Los cosmticos que [place].  Lo que bebe.  Lo que llev puesto en la zona afectada. [place] alhajas.  Si le indicaron que use un vendaje, cudelo como se lo haya indicado el mdico. Esto incluye saber cundo cambiarlo y cundo quitrselo. SOLICITE ATENCIN MDICA SI:   La afeccin no mejora con tratamiento.  La afeccin empeora.  Observa signos de infeccin, como hinchazn, sensibilidad, enrojecimiento, dolor o calor en la zona afectada.  Tiene fiebre.  Aparecen nuevos sntomas. SOLICITE ATENCIN MDICA DE INMEDIATO SI:   Tiene dolor de cabeza intenso, dolor o rigidez en el cuello.  Vomita.  Se siente muy somnoliento.  Nota una lnea roja en la piel que sale de la zona afectada.  El hueso o la articulacin que se encuentran por debajo de la zona afectada le duelen despus de que la piel se haya curado.  La zona afectada se oscurece.  Tiene dificultad para respirar.   Esta informacin no tiene Marine scientist el consejo del mdico. Asegrese de hacerle al mdico cualquier pregunta que tenga.   Document Released: 02/12/2005 Document Revised: 01/24/2015 Elsevier Interactive Patient Education [employer].

## 2015-08-04 ENCOUNTER — Emergency Department (INDEPENDENT_AMBULATORY_CARE_PROVIDER_SITE_OTHER)
Admission: EM | Admit: 2015-08-04 | Discharge: 2015-08-04 | Disposition: A | Discharge status: Home / Self Care | Payer: Self-pay | Source: Home / Self Care | Attending: Family Medicine | Admitting: Family Medicine

## 2015-08-04 ENCOUNTER — Encounter (HOSPITAL_COMMUNITY): Payer: Self-pay | Admitting: *Deleted

## 2015-08-04 DIAGNOSIS — L259 Unspecified contact dermatitis, unspecified cause: Secondary | ICD-10-CM

## 2015-08-04 HISTORY — DX: Unspecified asthma, uncomplicated: J45.909

## 2015-08-04 MED ORDER — PREDNISONE 10 MG (21) PO TBPK: ORAL_TABLET | ORAL | Status: DC

## 2015-08-04 NOTE — ED Provider Notes (Signed)
CSN: [id number]     Arrival date & time 08/04/15  1329 History   First MD Initiated Contact with Patient 08/04/15 1528     Chief Complaint  Patient presents with  . Facial Pain   (Consider location/radiation/quality/duration/timing/severity/associated sxs/prior Treatment) HPI Comments: 45 year old female presents with itchy rash to the face and portions of the upper extremities. She states this started in the last week of February. She was seen at this urgent care on or near February 28 and treated with topical triamcinolone and Benadryl cream. She also received prednisone although she did not remember that. She states that there was some improvement however later on the rash got worse. She is now complaining of itching and burning about the face as well has a few areas of redness and itching to the upper extremities. Denies intraoral lesions or and asthma. Denies problems breathing or cough. The patient brought the tube of triamcinolone cream with her and very little of that crane has been used. Question compliance with the triamcinolone cream.   Past Medical History  Diagnosis Date  . Bronchitis   . Rosacea   . Asthma    Past Surgical History  Procedure Laterality Date  . Cesarean section     Family History  Problem Relation Age of Onset  . Diabetes Mother   . Diabetes Father   . Allergic Disorder Mother    Social History  Substance Use Topics  . Smoking status: Never Smoker   . Smokeless tobacco: None  . Alcohol Use: No   OB History    Gravida Para Term Preterm AB TAB SAB Ectopic Multiple Living   5 3 2 1 2  2   3      Review of Systems  Constitutional: Negative for fever, activity change and fatigue.  HENT: Negative.   Eyes: Negative for discharge, itching and visual disturbance.  Respiratory: Negative.   Gastrointestinal: Negative.   Musculoskeletal: Negative.   Skin: Positive for rash.    Allergies  Other  Home Medications   Prior to Admission medications    Medication Sig Start Date End Date Taking? Authorizing Provider  Fexofenadine HCl (ALLEGRA PO) Take by mouth daily.   Yes Historical Provider, MD  elvitegravir-cobicistat-emtricitabine-tenofovir (STRIBILD) 150-150-200-300 MG TABS tablet Take 1 tablet by mouth daily with breakfast. 06/12/15   [place] Lions, PA-C  predniSONE (STERAPRED UNI-PAK 21 TAB) 10 MG (21) TBPK tablet Dispense 12 day pack. Take as directed with food. 08/04/15   Janne Napoleon, NP   Meds Ordered and Administered this Visit  Medications - No data to display  BP 139/73 mmHg  Pulse 76  Temp(Src) 98 F (36.7 C) (Oral)  Resp 16  SpO2 98%  LMP 07/20/2015 (Exact Date) No data found.   Physical Exam  Constitutional: She is oriented to person, place, and time. She appears well-developed and well-nourished. No distress.  HENT:  Mouth/Throat: Oropharynx is clear and moist. No oropharyngeal exudate.  Eyes: Conjunctivae and EOM are normal.  Neck: Normal range of motion. Neck supple.  Cardiovascular: Normal rate.   Pulmonary/Chest: Effort normal. No respiratory distress.  Musculoskeletal: She exhibits no edema.  Neurological: She is alert and oriented to person, place, and time. She exhibits normal muscle tone.  Skin: Skin is warm and dry.  There is a erythematous papular rash involving most of the face including the forehead. The papules are quite small. The rash on the upper extremities are in small patches of macular papular erythematous lesions. There are several areas of  scratch marks from itching.  Psychiatric: She has a normal mood and affect.  Nursing note and vitals reviewed.   ED Course  Procedures (including critical care time)  Labs Review Labs Reviewed - No data to display  Imaging Review No results found.   Visual Acuity Review  Right Eye Distance:   Left Eye Distance:   Bilateral Distance:    Right Eye Near:   Left Eye Near:    Bilateral Near:         MDM   1. Contact dermatitis    This does not appear to be a rosacea exacerbation. No malar rash. Appearance and distribution are not consistent with  Rosacea. (Contact Dermatitis) Apply the triamcinolone cream to the affected areas twice a day. Approximately 30 minutes later he may apply the Benadryl cream or gel to help with itching. Benadryl may be applied every 4 hours as needed. Take the prednisone taper pack as directed. When taking that medicine be sure to eat first. Cool compresses to the face when you are having itching and burning Continue taking the over-the-counter antihistamine such as Allegra or Zyrtec. If the rash is getting worse or he develops new symptoms or problems follow-up with your primary care doctor. He may need to see a dermatologist if not getting better.     Janne Napoleon, NP 08/04/15 [phone]

## 2015-08-04 NOTE — ED Notes (Signed)
C/O facial pruritis and "burning" and to lesser degree on bilat upper arms; started when she got poison ivy 2/28 - improved, but never went completely away.  Yesterday itching and burning got worse.  Has been applying Benadryl cream, triamcinolone cream, and aloe lotion.  Has been using Allegra.  Uses Neutrogena sunscreen (has been using "a long time").  Hx rosacea.

## 2015-08-04 NOTE — Discharge Instructions (Signed)
Dermatitis de contacto (Contact Dermatitis) Apply the triamcinolone cream to the affected areas twice a day. Approximately 30 minutes later he may apply the Benadryl cream or gel to help with itching. Benadryl may be applied every 4 hours as needed. Take the prednisone taper pack as directed. When taking that medicine be sure to eat first. Cool compresses to the face when you are having itching and burning Continue taking the over-the-counter antihistamine such as Allegra or Zyrtec. If the rash is getting worse or he develops new symptoms or problems follow-up with your primary care doctor. He may need to see a dermatologist if not getting better. La dermatitis es el enrojecimiento, el dolor y la hinchazn (inflamacin) de la piel. La dermatitis de contacto es una reaccin a ciertas sustancias que entran en contacto con la piel. Hay dos tipos de dermatitis de contacto:   Dermatitis de contacto irritativa. La causa de este tipo de dermatitis es algo que irrita la piel, como las manos secas por lavarlas en exceso. Este tipo no requiere la exposicin previa a la sustancia que caus la reaccin. Este tipo es ms frecuente.  Dermatitis alrgica por contacto. La causa de este tipo de dermatitis es una sustancia a la cual se es Air cabin crew, como una alergia al nquel o a la hiedra venenosa. Este tipo solo ocurre si ha estado expuesto anteriormente a la sustancia (alrgeno). Al repetir la exposicin, el organismo reacciona a la sustancia. Este tipo es menos frecuente. CAUSAS  Muchas sustancias diferentes pueden causar dermatitis de contacto. La causa ms frecuente de la dermatitis de contacto irritativa es la exposicin a lo siguiente:   Maquillaje.   Jabones perfumados.   Detergentes.   Lavandina.   cidos.   Sales metlicas, como el nquel.  Las causas de la dermatitis alrgica son las siguientes:   Plantas venenosas.   Productos qumicos.   Alhajas.   Ltex.   Medicamentos.    Conservantes que se utilizan en determinados productos, como la ropa.  FACTORES DE RIESGO Es ms probable que Personnel officer se manifieste en:   Las personas que tienen trabajos que las exponen a irritantes o a Futures trader.  Las [employer] tienen determinadas enfermedades, por ejemplo, asma o eccema.  SNTOMAS  Los sntomas de esta afeccin pueden presentarse en cualquier parte del cuerpo con la que usted toque el irritante o donde la sustancia irritante lo haya tocado. Algunos sntomas son los siguientes:  Sequedad o Teacher, music.   Enrojecimiento.   Grietas.   Picazn.   Dolor o sensacin de ardor.   Ampollas.  Secrecin de pequeas cantidades de sangre o de lquido transparente que emanan de las grietas de la piel. En el caso de la dermatitis de Risk manager, puede haber hinchazn solo en algunas partes del cuerpo, como la boca o los genitales.  DIAGNSTICO  Esta afeccin se diagnostica mediante la historia clnica y un examen fsico. Se puede realizar una prueba del parche para ayudar a Office manager causa. Si la afeccin guarda relacin con Leander Rams, tal vez deba consultar a un especialista en medicina ocupacional. TRATAMIENTO El tratamiento de esta afeccin incluye determinar la causa de la reaccin y proteger la piel de nuevos contactos. El tratamiento tambin puede incluir lo siguiente:   Cremas o ungentos con corticoides. En los casos ms graves ser necesario aplicar corticoides por va oral.  Ungentos con antibiticos o antibacterianos, si hay una infeccin en la piel.  Antihistamnicos en forma de locin o por va oral para calmar la  picazn.  Un vendaje. INSTRUCCIONES PARA EL CUIDADO EN EL HOGAR Cuidado de la piel  Humctese la piel segn sea necesario.   Aplique compresas fras en las zonas afectadas.  Trate de tomar un bao con lo siguiente:  Sales de [place]. Siga las instrucciones del envase. Puede conseguirlas en la tienda de  comestibles o la farmacia local.  Bicarbonato de sodio. Vierta un poco en la baera como se lo haya indicado el [place] instrucciones del envase. Puede conseguirla en la tienda de comestibles o la farmacia local.  Intente colocarse una pasta de bicarbonato de sodio sobre la piel. Agregue agua al bicarbonato hasta que tenga la consistencia de una pasta.  No se rasque la piel.  Bese con menos frecuencia, por ejemplo, [employer].  Bese con agua templada. No use agua caliente. [place] o aplquese los medicamentos de venta libre y recetados solamente como se lo haya indicado el mdico.   Si le recetaron un antibitico, tmelo o aplqueselo como se lo haya indicado el mdico. No deje de usar el antibitico aunque la afeccin empiece a Teacher, English as a foreign language. Instrucciones generales  Concurra a todas las visitas de control como se lo haya indicado el mdico. Esto es importante.  Evite la sustancia que ha causado la erupcin. Si no sabe qu la caus, lleve un diario para tratar de identificar la causa. Escriba los siguientes datos:  Lo que come.  Los cosmticos que [place].  Lo que bebe.  Lo que llev puesto en la zona afectada. [place] alhajas.  Si le indicaron que use un vendaje, cudelo como se lo haya indicado el mdico. Esto incluye saber cundo cambiarlo y cundo quitrselo. SOLICITE ATENCIN MDICA SI:   La afeccin no mejora con tratamiento.  La afeccin empeora.  Observa signos de infeccin, como hinchazn, sensibilidad, enrojecimiento, dolor o calor en la zona afectada.  Tiene fiebre.  Aparecen nuevos sntomas. SOLICITE ATENCIN MDICA DE INMEDIATO SI:   Tiene dolor de cabeza intenso, dolor o rigidez en el cuello.  Vomita.  Se siente muy somnoliento.  Nota una lnea roja en la piel que sale de la zona afectada.  El hueso o la articulacin que se encuentran por debajo de la zona afectada le duelen despus de que la piel  se haya curado.  La zona afectada se oscurece.  Tiene dificultad para respirar.   Esta informacin no tiene Marine scientist el consejo del mdico. Asegrese de hacerle al mdico cualquier pregunta que tenga.   Document Released: 02/12/2005 Document Revised: 01/24/2015 Elsevier Interactive Patient Education [employer].

## 2015-12-12 ENCOUNTER — Encounter (HOSPITAL_COMMUNITY): Payer: Self-pay | Admitting: Family Medicine

## 2015-12-12 ENCOUNTER — Ambulatory Visit (HOSPITAL_COMMUNITY)
Admission: EM | Admit: 2015-12-12 | Discharge: 2015-12-12 | Disposition: A | Discharge status: Home / Self Care | Payer: No Typology Code available for payment source | Attending: Family Medicine | Admitting: Family Medicine

## 2015-12-12 DIAGNOSIS — N390 Urinary tract infection, site not specified: Secondary | ICD-10-CM

## 2015-12-12 LAB — POCT URINALYSIS DIP (DEVICE)
Bilirubin Urine: NEGATIVE
Glucose, UA: NEGATIVE mg/dL
Ketones, ur: NEGATIVE mg/dL
Nitrite: NEGATIVE
PH: 5.5 (ref 5.0–8.0)
PROTEIN: NEGATIVE mg/dL
SPECIFIC GRAVITY, URINE: 1.015 (ref 1.005–1.030)
UROBILINOGEN UA: 0.2 mg/dL (ref 0.0–1.0)

## 2015-12-12 MED ORDER — CIPROFLOXACIN HCL 500 MG PO TABS: 500.0000 mg | ORAL_TABLET | Freq: Two times a day (BID) | ORAL | 0 refills | Status: DC

## 2015-12-12 MED ORDER — PHENAZOPYRIDINE HCL 200 MG PO TABS: 200.0000 mg | ORAL_TABLET | Freq: Three times a day (TID) | ORAL | 0 refills | Status: DC

## 2015-12-12 NOTE — ED Triage Notes (Signed)
Pt here for burning with urination x 1 month.

## 2015-12-12 NOTE — ED Provider Notes (Signed)
CSN: [id number]     Arrival date & time 12/12/15  1506 History   None    No chief complaint on file.  (Consider location/radiation/quality/duration/timing/severity/associated sxs/prior Treatment) Patient has been having some dysuria for over 2days     Dysuria  Pain quality:  Burning Onset quality:  Sudden Duration:  2 days Timing:  Constant Progression:  Worsening Chronicity:  Recurrent Recent urinary tract infections: yes   Relieved by:  Nothing Worsened by:  Nothing Ineffective treatments:  None tried   Past Medical History:  Diagnosis Date  . Asthma   . Bronchitis   . Rosacea    Past Surgical History:  Procedure Laterality Date  . CESAREAN SECTION     Family History  Problem Relation Age of Onset  . Diabetes Mother   . Diabetes Father   . Allergic Disorder Mother    Social History  Substance Use Topics  . Smoking status: Never Smoker  . Smokeless tobacco: Not on file  . Alcohol use No   OB History    Gravida Para Term Preterm AB Living   5 3 2 1 2 3    SAB TAB Ectopic Multiple Live Births   2             Review of Systems  Constitutional: Negative.   HENT: Negative.   Eyes: Negative.   Respiratory: Negative.   Cardiovascular: Negative.   Gastrointestinal: Negative.   Endocrine: Negative.   Genitourinary: Positive for dysuria.  Musculoskeletal: Negative.   Skin: Negative.   Allergic/Immunologic: Negative.   Neurological: Negative.   Hematological: Negative.   Psychiatric/Behavioral: Negative.     Allergies  Other  Home Medications   Prior to Admission medications   Medication Sig Start Date End Date Taking? Authorizing Provider  elvitegravir-cobicistat-emtricitabine-tenofovir (STRIBILD) 150-150-200-300 MG TABS tablet Take 1 tablet by mouth daily with breakfast. 06/12/15   [place] Lions, PA-C  Fexofenadine HCl (ALLEGRA PO) Take by mouth daily.    Historical Provider, MD  predniSONE (STERAPRED UNI-PAK 21 TAB) 10 MG (21) TBPK tablet  Dispense 12 day pack. Take as directed with food. 08/04/15   Janne Napoleon, NP   Meds Ordered and Administered this Visit  Medications - No data to display  BP 114/61 (BP Location: Left Arm)   Pulse 67   Temp 97.8 F (36.6 C) (Oral)   Resp 18   Ht 5\' 2"  (1.575 m)   Wt 190 lb (86.2 kg)   SpO2 98%   BMI 34.75 kg/m  No data found.   Physical Exam  Constitutional: She appears well-developed and well-nourished.  HENT:  Head: Normocephalic and atraumatic.  Eyes: EOM are normal. Pupils are equal, round, and reactive to light.  Neck: Normal range of motion. Neck supple.  Cardiovascular: Normal rate, regular rhythm and normal heart sounds.   Pulmonary/Chest: Effort normal and breath sounds normal.  Abdominal: Soft. Bowel sounds are normal.    Urgent Care Course   Clinical Course    Procedures (including critical care time)  Labs Review Labs Reviewed  POCT URINALYSIS DIP (DEVICE) - Abnormal; Notable for the following:       Result Value   Hgb urine dipstick TRACE (*)    Leukocytes, UA SMALL (*)    All other components within normal limits    Imaging Review No results found.   Visual Acuity Review  Right Eye Distance:   Left Eye Distance:   Bilateral Distance:    Right Eye Near:   Left Eye Near:  Bilateral Near:         MDM  UTI - Cipro 500mg  one po bid x 7 days #14 Pyridium 200mg  one po tid x 2 days #6      Lysbeth Penner, FNP 12/12/15 1648

## 2016-06-06 ENCOUNTER — Encounter (HOSPITAL_COMMUNITY): Payer: Self-pay | Admitting: *Deleted

## 2016-06-06 ENCOUNTER — Ambulatory Visit (HOSPITAL_COMMUNITY)
Admission: EM | Admit: 2016-06-06 | Discharge: 2016-06-06 | Disposition: A | Discharge status: Home / Self Care | Payer: No Typology Code available for payment source | Attending: Family Medicine | Admitting: Family Medicine

## 2016-06-06 DIAGNOSIS — R110 Nausea: Secondary | ICD-10-CM

## 2016-06-06 DIAGNOSIS — H8309 Labyrinthitis, unspecified ear: Secondary | ICD-10-CM

## 2016-06-06 MED ORDER — ONDANSETRON 4 MG PO TBDP: 4.0000 mg | ORAL_TABLET | Freq: Three times a day (TID) | ORAL | 0 refills | Status: DC | PRN

## 2016-06-06 MED ORDER — MECLIZINE HCL 25 MG PO TABS: 25.0000 mg | ORAL_TABLET | Freq: Three times a day (TID) | ORAL | 0 refills | Status: DC | PRN

## 2016-06-06 MED ORDER — AZITHROMYCIN 250 MG PO TABS: 1000.0000 mg | ORAL_TABLET | Freq: Once | ORAL | Status: DC

## 2016-06-06 NOTE — ED Provider Notes (Signed)
CSN: [id number]     Arrival date & time 06/06/16  1118 History   First MD Initiated Contact with Patient 06/06/16 1336     Chief Complaint  Patient presents with  . Nausea   (Consider location/radiation/quality/duration/timing/severity/associated sxs/prior Treatment) C/o dizziness, ringing in right ear, and nausea.   The history is provided by the patient.  Dizziness  Quality:  Head spinning Severity:  Mild Onset quality:  Sudden Duration:  1 day Timing:  Constant Progression:  Waxing and waning Chronicity:  New Relieved by:  Nothing Worsened by:  Nothing Ineffective treatments:  None tried Associated symptoms: tinnitus     Past Medical History:  Diagnosis Date  . Asthma   . Bronchitis   . Rosacea    Past Surgical History:  Procedure Laterality Date  . CESAREAN SECTION     Family History  Problem Relation Age of Onset  . Diabetes Mother   . Allergic Disorder Mother   . Diabetes Father    Social History  Substance Use Topics  . Smoking status: Never Smoker  . Smokeless tobacco: Never Used  . Alcohol use No   OB History    Gravida Para Term Preterm AB Living   5 3 2 1 2 3    SAB TAB Ectopic Multiple Live Births   2             Review of Systems  Constitutional: Negative.   HENT: Positive for congestion and tinnitus.   Eyes: Negative.   Respiratory: Negative.   Cardiovascular: Negative.   Gastrointestinal: Negative.   Endocrine: Negative.   Genitourinary: Negative.   Musculoskeletal: Negative.   Neurological: Positive for dizziness.    Allergies  Other  Home Medications   Prior to Admission medications   Medication Sig Start Date End Date Taking? Authorizing Provider  DiphenhydrAMINE HCl (BENADRYL PO) Take by mouth.   Yes Historical Provider, MD  ciprofloxacin (CIPRO) 500 MG tablet Take 1 tablet (500 mg total) by mouth 2 (two) times daily. 12/12/15   Lysbeth Penner, FNP  elvitegravir-cobicistat-emtricitabine-tenofovir (STRIBILD)  150-150-200-300 MG TABS tablet Take 1 tablet by mouth daily with breakfast. 06/12/15   [place] Lions, PA-C  Fexofenadine HCl (ALLEGRA PO) Take by mouth daily.    Historical Provider, MD  meclizine (ANTIVERT) 25 MG tablet Take 1 tablet (25 mg total) by mouth 3 (three) times daily as needed for dizziness. 06/06/16   Lysbeth Penner, FNP  ondansetron (ZOFRAN ODT) 4 MG disintegrating tablet Take 1 tablet (4 mg total) by mouth every 8 (eight) hours as needed for nausea or vomiting. 06/06/16   Lysbeth Penner, FNP  phenazopyridine (PYRIDIUM) 200 MG tablet Take 1 tablet (200 mg total) by mouth 3 (three) times daily. 12/12/15   Lysbeth Penner, FNP  predniSONE (STERAPRED UNI-PAK 21 TAB) 10 MG (21) TBPK tablet Dispense 12 day pack. Take as directed with food. 08/04/15   Janne Napoleon, NP   Meds Ordered and Administered this Visit   Medications  azithromycin (ZITHROMAX) tablet 1,000 mg (not administered)    BP 119/57 (BP Location: Left Arm)   Pulse 65   Temp 97.8 F (36.6 C) (Oral)   Resp 18   LMP 05/12/2016   SpO2 98%  No data found.   Physical Exam  Constitutional: She is oriented to person, place, and time. She appears well-developed and well-nourished.  HENT:  Head: Normocephalic and atraumatic.  Right Ear: External ear normal.  Left Ear: External ear normal.  Mouth/Throat: Oropharynx is clear  and moist.  Eyes: Conjunctivae and EOM are normal. Pupils are equal, round, and reactive to light.  Neck: Normal range of motion. Neck supple.  Cardiovascular: Normal rate, regular rhythm and normal heart sounds.   Pulmonary/Chest: Effort normal and breath sounds normal.  Abdominal: Soft. Bowel sounds are normal.  Neurological: She is alert and oriented to person, place, and time.  Nursing note and vitals reviewed.   Urgent Care Course     Procedures (including critical care time)  Labs Review Labs Reviewed - No data to display  Imaging Review No results found.   Visual Acuity  Review  Right Eye Distance:   Left Eye Distance:   Bilateral Distance:    Right Eye Near:   Left Eye Near:    Bilateral Near:         MDM   1. Nausea   2. Labyrinthitis, unspecified laterality    Meclizine 25mg  one po tid prn #30 Push po fluids, rest, tylenol and motrin otc prn as directed for fever, arthralgias, and myalgias.  Follow up prn if sx's continue or persist.    Lysbeth Penner, FNP 06/06/16 1355

## 2016-06-06 NOTE — ED Triage Notes (Signed)
Pt   Reports   Sensation  Of   Roaring/   Ringing  In  r   Ear

## 2016-06-06 NOTE — ED Triage Notes (Signed)
Pt  Reports  She  Woke  Up   With  Ringing  In  Her  r  Ear    With  Nausea   As   Well   As  Throwing  Up           Pt   Reports  Pain  In  Leg      Off   And on

## 2016-10-07 ENCOUNTER — Encounter (INDEPENDENT_AMBULATORY_CARE_PROVIDER_SITE_OTHER): Payer: Self-pay | Admitting: Physician Assistant

## 2016-10-07 ENCOUNTER — Ambulatory Visit (INDEPENDENT_AMBULATORY_CARE_PROVIDER_SITE_OTHER): Payer: Self-pay | Admitting: Physician Assistant

## 2016-10-07 VITALS — BP 113/70 | HR 72 | Temp 98.0°F | Ht 62.5 in | Wt 185.4 lb

## 2016-10-07 DIAGNOSIS — H8301 Labyrinthitis, right ear: Secondary | ICD-10-CM

## 2016-10-07 MED ORDER — MECLIZINE HCL 25 MG PO TABS: 25.0000 mg | ORAL_TABLET | Freq: Three times a day (TID) | ORAL | 0 refills | Status: DC | PRN

## 2016-10-07 NOTE — Patient Instructions (Signed)
Laberintitis (Labyrinthitis) La laberintitis es una infeccin del odo interno. El odo interno es un sistema formado por conductos y cavidades que est lleno de lquido (laberinto). Las neuronas del odo interno envan seales al cerebro para la audicin y el equilibrio. Cuando en los conductos y canales se introducen microbios diminutos (microorganismos), estos daan las neuronas que envan mensajes al cerebro. La laberintitis puede causar alteraciones en la audicin y el equilibrio. La mayora de los casos de laberintitis se presentan de Geographical information systems officer repentina y se resuelven en el trmino de Corporate treasurer. Si la infeccin daa partes del laberinto, algunos sntomas Geneticist, molecular (laberintitis crnica). CAUSAS Los virus son la causa ms frecuente de la laberintitis. Los virus que proliferan en el laberinto son los mismos que causan otras enfermedades, por ejemplo:  Mononucleosis.  Sarampin.  Gripe.  Herpes. Las bacterias tambin pueden causar laberintitis cuando se propagan al laberinto desde una infeccin en el cerebro o el odo medio. Las bacterias pueden causar lo siguiente:  Laberintitis serosa. Este tipo se desarrolla cuando las bacterias producen una sustancia txica que penetra en el laberinto.  Laberintitis purulenta. Este tipo se desarrolla cuando las bacterias penetran en el laberinto. [place] a un riesgo mayor de tener laberintitis si:  Bebe mucho alcohol.  Fuma.  Toma determinados medicamentos.  No descansa bien (est fatigado).  Est bajo mucho estrs.  Tener alergias.  Recientemente, tuvo una infeccin en la nariz o la garganta (infeccin de las vas respiratorias altas) o una infeccin en el odo. SNTOMAS Generalmente, los sntomas de laberintitis comienzan de [place] repentina y pueden ser leves o intensos e incluir lo siguiente:  Tree surgeon.  Prdida auditiva.  La sensacin de que se est moviendo mientras est quieto  (vrtigo).  Ruidos en el odo (tinnitus).  Nuseas y vmitos.  Dificultad para enfocar los ojos. Los sntomas de laberintitis crnica pueden incluir lo siguiente:  Fatiga.  Confusin.  Prdida auditiva.  Tinnitus.  Falta de equilibrio.  Vrtigo despus de Optometrist movimientos bruscos con la cabeza. DIAGNSTICO El mdico puede sospechar la presencia de laberintitis si repentinamente usted se siente mareado y pierde la audicin, en especial si ha tenido una infeccin reciente de las vas respiratorias altas. El Special educational needs teacher un examen fsico para lo siguiente:  Hydrographic surveyor una infeccin en los odos.  Hacerle una prueba de equilibrio.  Controlar sus movimientos oculares. El mdico puede hacer varios estudios para descartar otras causas para los sntomas y ayudar a Nurse, children's laberintitis. Estos pueden incluir lo siguiente:  Estudios de diagnstico por imgenes, como una tomografa computarizada (TC) o una resonancia magntica (RM), en busca de otras causas para los sntomas.  Pruebas de audicin.  Una electronistagmografa (ENG) para controlar su equilibrio. Imogene laberintitis depende de la causa. Si es viral, puede mejorar sin tratamiento. Si es [place], tal vez deba tomar medicamentos para combatir la infeccin (antibiticos). [place], tal vez reciba tratamiento para aliviar los sntomas de la laberintitis. Los tratamientos pueden incluir lo siguiente:  Medicamentos para:  Optometrist.  [place]  Tratar la zona inflamada.  Acelerar la recuperacin.  Hacer reposo en la cama hasta que desaparezcan los [place].  Recibir lquidos por va intravenosa. Es posible que necesite recibir tratamiento si hay escasez de lquido en su organismo (se deshidrata) debido a las nuseas y los vmitos permanentes. [place] los medicamentos solamente como se lo haya indicado el mdico.  Si  le recetaron antibiticos, asegrese de terminarlos, incluso si comienza a sentirse mejor.  Descanse todo lo que pueda.  Evite los ruidos fuertes y las luces brillantes.  No haga movimientos repentinos hasta no tener ms mareos.  No conduzca hasta que el mdico lo autorice.  Beba suficiente lquido para Consulting civil engineer orina clara o de color amarillo plido.  Trabaje con un fisioterapeuta si sigue estando mareado despus de [employer]. El terapeuta puede ensearle ejercicios para ayudarlo a que se adapte a la sensacin de Chief of Staff (ejercicios de rehabilitacin vestibular).  Concurra a todas las visitas de control como se lo haya indicado el mdico. Esto es importante. SOLICITE ATENCIN MDICA SI:  Los medicamentos no [employer].  Los sntomas se prolongan durante ms de [employer].  Tiene fiebre. SOLICITE ATENCIN MDICA DE INMEDIATO SI:  Se siente muy mareado.  Tiene nuseas o vmitos que no desaparecen.  Hay un deterioro muy rpido de la audicin. Esta informacin no tiene Marine scientist el consejo del mdico. Asegrese de hacerle al mdico cualquier pregunta que tenga. Document Released: 05/26/2014 Document Revised: 05/26/2014 Document Reviewed: 01/04/2014 Elsevier Interactive Patient Education  2017 [employer].

## 2016-10-07 NOTE — Progress Notes (Signed)
Subjective:  Patient ID: Tiffany Vazquez, female    DOB: Aug 11, 1970  Age: 46 y.o. MRN: [id number]  CC: right ear discomfort  HPI Tiffany Vazquez is a 46 y.o. female with no significant PMH presents with right ear ringing since April that sent her to Urgent care. Was diagnosed with Labyrinthitis and nausea. Feels that her hearing is slightly decreased compared to the left ear. Reports becoming dizzy with rapid head movement. There is still feeling of nausea at times. Slight ear ache. Thinks that eating crunchy foods may contribute to her symptoms. Overall, symptoms are much better than at onset.     Would like a dental referral for dental cleaning and evaluation. Thinks a filling may have come loose in a bottom right molar which has become bothersome. Does not have the orange card yet.       Outpatient Medications Prior to Visit  Medication Sig Dispense Refill  . Fexofenadine HCl (ALLEGRA PO) Take by mouth daily.    . DiphenhydrAMINE HCl (BENADRYL PO) Take by mouth.    . elvitegravir-cobicistat-emtricitabine-tenofovir (STRIBILD) 150-150-200-300 MG TABS tablet Take 1 tablet by mouth daily with breakfast. (Patient not taking: Reported on 10/07/2016) 30 tablet 0  . ondansetron (ZOFRAN ODT) 4 MG disintegrating tablet Take 1 tablet (4 mg total) by mouth every 8 (eight) hours as needed for nausea or vomiting. (Patient not taking: Reported on 10/07/2016) 20 tablet 0  . phenazopyridine (PYRIDIUM) 200 MG tablet Take 1 tablet (200 mg total) by mouth 3 (three) times daily. (Patient not taking: Reported on 10/07/2016) 6 tablet 0  . predniSONE (STERAPRED UNI-PAK 21 TAB) 10 MG (21) TBPK tablet Dispense 12 day pack. Take as directed with food. (Patient not taking: Reported on 10/07/2016) 42 tablet 0  . ciprofloxacin (CIPRO) 500 MG tablet Take 1 tablet (500 mg total) by mouth 2 (two) times daily. 14 tablet 0  . meclizine (ANTIVERT) 25 MG tablet Take 1 tablet (25 mg total) by mouth 3 (three) times daily  as needed for dizziness. (Patient not taking: Reported on 10/07/2016) 30 tablet 0   No facility-administered medications prior to visit.      ROS Review of Systems  Constitutional: Negative for chills, fever and malaise/fatigue.  HENT: Positive for ear pain, hearing loss and tinnitus. Negative for congestion, ear discharge, sinus pain and sore throat.        Missing filling? Mild tooth discomfort.  Eyes: Negative for blurred vision.  Respiratory: Negative for shortness of breath.   Cardiovascular: Negative for chest pain and palpitations.  Gastrointestinal: Negative for abdominal pain and nausea.  Genitourinary: Negative for dysuria and hematuria.  Musculoskeletal: Negative for joint pain and myalgias.  Skin: Negative for rash.  Neurological: Negative for tingling and headaches.  Psychiatric/Behavioral: Negative for depression. The patient is not nervous/anxious.     Objective:  BP 113/70 (BP Location: Left Arm, Patient Position: Sitting, Cuff Size: Large)   Pulse 72   Temp 98 F (36.7 C) (Oral)   Ht 5' 2.5" (1.588 m)   Wt 185 lb 6.4 oz (84.1 kg)   LMP 09/29/2016 (Approximate)   SpO2 95%   BMI 33.37 kg/m   BP/Weight 10/07/2016 06/06/2016 3/83/3383  Systolic BP 291 916 606  Diastolic BP 70 57 61  Wt. (Lbs) 185.4 - 190  BMI 33.37 - 34.75      Physical Exam  Constitutional: She is oriented to person, place, and time.  Well developed, overweight, NAD, polite  HENT:  Head: Normocephalic and atraumatic.  Right  TM normal. Left TM normal. TMJ displacement felt bilaterally.  Eyes: No scleral icterus.  Neck: Normal range of motion. Neck supple. No thyromegaly present.  Cardiovascular: Normal rate, regular rhythm and normal heart sounds.   Pulmonary/Chest: Effort normal and breath sounds normal.  Lymphadenopathy:    She has no cervical adenopathy.  Neurological: She is alert and oriented to person, place, and time. No cranial nerve deficit. Coordination normal.  Dix Hallpike  negative.  Skin: Skin is warm and dry. No rash noted. No erythema. No pallor.  Psychiatric: She has a normal mood and affect. Her behavior is normal. Thought content normal.  Vitals reviewed.    Assessment & Plan:   1. Labyrinthitis of right ear - Begin meclizine (ANTIVERT) 25 MG tablet; Take 1 tablet (25 mg total) by mouth 3 (three) times daily as needed for dizziness.  Dispense: 30 tablet; Refill: 0   Meds ordered this encounter  Medications  . meclizine (ANTIVERT) 25 MG tablet    Sig: Take 1 tablet (25 mg total) by mouth 3 (three) times daily as needed for dizziness.    Dispense:  30 tablet    Refill:  0    Order Specific Question:   Supervising Provider    Answer:   Tresa Garter [[id number]]    Follow-up: Return in about 4 weeks (around 11/04/2016) for full physical.   Clent Demark PA

## 2016-10-24 ENCOUNTER — Ambulatory Visit (INDEPENDENT_AMBULATORY_CARE_PROVIDER_SITE_OTHER): Payer: No Typology Code available for payment source

## 2016-12-29 ENCOUNTER — Ambulatory Visit (INDEPENDENT_AMBULATORY_CARE_PROVIDER_SITE_OTHER): Payer: Self-pay | Admitting: Physician Assistant

## 2016-12-29 ENCOUNTER — Encounter (INDEPENDENT_AMBULATORY_CARE_PROVIDER_SITE_OTHER): Payer: Self-pay | Admitting: Physician Assistant

## 2016-12-29 VITALS — BP 109/71 | HR 69 | Temp 98.0°F | Wt 185.4 lb

## 2016-12-29 DIAGNOSIS — M545 Low back pain, unspecified: Secondary | ICD-10-CM

## 2016-12-29 DIAGNOSIS — R202 Paresthesia of skin: Secondary | ICD-10-CM

## 2016-12-29 DIAGNOSIS — G44219 Episodic tension-type headache, not intractable: Secondary | ICD-10-CM

## 2016-12-29 LAB — POCT URINALYSIS DIPSTICK
BILIRUBIN UA: NEGATIVE
Glucose, UA: NEGATIVE
Ketones, UA: NEGATIVE
Leukocytes, UA: NEGATIVE
NITRITE UA: NEGATIVE
PH UA: 5.5 (ref 5.0–8.0)
Protein, UA: NEGATIVE
Spec Grav, UA: >=1.03 — AB (ref 1.010–1.025)
Urobilinogen, UA: 0.2 E.U./dL

## 2016-12-29 LAB — POCT GLYCOSYLATED HEMOGLOBIN (HGB A1C): HEMOGLOBIN A1C: 5.9

## 2016-12-29 MED ORDER — CYCLOBENZAPRINE HCL 10 MG PO TABS: 10.0000 mg | ORAL_TABLET | Freq: Three times a day (TID) | ORAL | 0 refills | Status: DC | PRN

## 2016-12-29 MED ORDER — NAPROXEN 500 MG PO TABS: 500.0000 mg | ORAL_TABLET | Freq: Two times a day (BID) | ORAL | 0 refills | Status: DC

## 2016-12-29 NOTE — Patient Instructions (Addendum)
Dolor de espalda en adultos (Back Pain, Adult) El dolor de espalda es muy frecuente en los adultos.La causa del dolor de espalda es rara vez peligrosa y el dolor a menudo mejora con el tiempo.Es posible que se desconozca la causa de esta afeccin. Algunas causas comunes son las siguientes:  Distensin de los msculos o ligamentos que sostienen la columna vertebral.  Desgaste (degeneracin) de los discos vertebrales.  Artritis.  Lesiones directas en la espalda. En muchas personas, el dolor de espalda es recurrente. Como rara vez es peligroso, las personas pueden aprender a manejar esta afeccin por s mismas. INSTRUCCIONES PARA EL CUIDADO EN EL HOGAR Controle su dolor de espalda a fin de detectar algn cambio. Las siguientes indicaciones ayudarn a aliviar cualquier molestia que pueda sentir:  Permanezca activo. Si permanece sentado o de pie en un mismo lugar durante mucho tiempo, se tensiona la espalda. No se siente, conduzca o permanezca de pie en un mismo lugar durante ms de 30 minutos seguidos. Realice caminatas cortas en superficies planas tan pronto como le sea posible.Trate de caminar un poco ms de tiempo cada da.  Haga ejercicio regularmente como se lo haya indicado el mdico. El ejercicio ayuda a que su espalda se cure ms rpidamente. Tambin ayuda a prevenir futuras lesiones al mantener los msculos fuertes y flexibles.  No permanezca en la cama.Si hace reposo ms de 1 a 2 das, puede demorar su recuperacin.  Preste atencin a su cuerpo al inclinarse y levantarse. Las posiciones ms cmodas son las que ejercen menos tensin en la espalda en recuperacin. Siempre use tcnicas apropiadas para levantar objetos, como por ejemplo:  Flexionar las rodillas.  Mantener la carga cerca del cuerpo.  No torcerse.  Encuentre una posicin cmoda para dormir. Use un colchn firme y recustese de costado con las rodillas ligeramente flexionadas. Si se recuesta sobre la espalda, coloque  una almohada debajo de las rodillas.  Evite sentir ansiedad o estrs.El estrs aumenta la tensin muscular y puede empeorar el dolor de espalda.Es importante reconocer si se siente ansioso o estresado y aprender maneras de controlarlo, por ejemplo haciendo ejercicio.  Tome los medicamentos solamente como se lo haya indicado el mdico. Los medicamentos de venta libre para aliviar el dolor y la inflamacin a menudo son los ms eficaces.El mdico puede recetarle relajantes musculares.Estos medicamentos ayudan a calmar el dolor de modo que pueda reanudar ms rpidamente sus actividades normales y el ejercicio saludable.  Aplique hielo sobre la zona lesionada.  Ponga el hielo en una bolsa plstica.  Coloque una toalla entre la piel y la bolsa de hielo.  Deje el hielo durante 20minutos, 2 a 3veces por da, durante los primeros 2 o 3das. Despus de eso, puede alternar el hielo y el calor para reducir el dolor y los espasmos.  Mantenga un peso saludable. El exceso de peso ejerce presin adicional sobre la espalda y hace que resulte difcil mantener una buena postura. SOLICITE ATENCIN MDICA SI:  Siente un dolor que no se alivia con reposo o medicamentos.  Siente mucho dolor que se extiende a las piernas o los glteos.  El dolor no mejora en una semana.  Siente dolor por la noche.  Pierde peso.  Siente escalofros o fiebre. SOLICITE ATENCIN MDICA DE INMEDIATO SI:  Tiene nuevos problemas para controlar la vejiga o los intestinos.  Siente debilidad o adormecimiento inusuales en los brazos o en las piernas.  Siente nuseas o vmitos.  Siente dolor abdominal.  Siente que va a desmayarse. Esta informacin   Esta informacin no tiene Marine scientist el consejo del mdico. Asegrese de hacerle al mdico cualquier pregunta que tenga. Document Released: 05/05/2005 Document Revised: 05/26/2014 Document Reviewed: 09/06/2013 Elsevier Interactive Patient Education  2017 Elsevier Inc. Plan de  alimentacin para la prediabetes (Prediabetes Eating Plan) La prediabetes, tambin llamada intolerancia a la glucosa o alteracin de la glucosa en ayunas, es una afeccin que eleva los niveles de azcar en la sangre (glucemia) por encima de lo normal. Seguir una dieta saludable puede ayudar a mantener la prediabetes bajo control, y tambin reduce el riesgo de tener diabetes tipo2 y cardiopata, que es ms alto en las personas que tienen esta afeccin. Junto con la actividad fsica habitual, una dieta saludable:  Promueve la prdida de [place].  Ayuda a Environmental consultant de Dispensing optician.  Ayuda a mejorar la forma en que el organismo [place] la insulina. QU DEBO SABER ACERCA DE ESTE PLAN DE [place]?  Use el ndice glucmico (IG) para planificar las comidas. El ndice le informa con qu rapidez un alimento elevar su nivel de azcar en la sangre. Elija los alimentos con bajo IG. Estos tardan ms tiempo en subir el nivel de azcar en la sangre.  Preste mucha atencin a la cantidad de hidratos de carbono que hay en los alimentos que consume. Los hidratos de carbono [employer] niveles de Dispensing optician.  Lleve un registro de la cantidad de caloras que ingiere. Ingerir la cantidad correcta de caloras lo ayudar a Development worker, international aid peso saludable. Bajar alrededor del 7por ciento del peso inicial puede ayudar a Product/process development scientist la diabetes tipo2.  Tal vez deba seguir Web designer. Esta incluye una gran cantidad de verduras, carnes magras o pescado, cereales integrales, frutas, as como aceites y grasas saludables.  QU ALIMENTOS PUEDO COMER? Cereales Cereales integrales, como panes, galletas, cereales y pastas de salvado o integrales. Avena sin azcar. Trigo burgol. Cebada. Quinua. Arroz integral. Tortillas o tacos de harina de maz o de salvado. [place] Valeda Malm. [place]. Guisantes. Remolachas. Coliflor. Repollo. Brcoli. Zanahorias. Tomates. [place]. [place]. Hierbas. Pimientos.  Cebollas. Pepinos. Repollitos de Bruselas. Frutas Frutos rojos. Bananas. Manzanas. Naranjas. Uvas. Papaya. Mango. [place]. Kiwi. Pomelo. Cerezas. Carnes y otras fuentes de protenas Mariscos. Carnes [place], entre ellas, pollo y [place] o cortes magros de carne de cerdo y de [place]. Tofu. Huevos. Los frutos secos. Frijoles. Lcteos Productos lcteos descremados o semidescremados, como yogur, queso cottage y [place]. [employer]. T. Caf. Gaseosas sin azcar o dietticas. Agua de [place]. Leche. Productos alternativos [place], como leche de soja o de [place]. Condimentos [place]. Salsa de pepinillos. [place] con bajo contenido de [place] y de Location manager. Salsa barbacoa con bajo contenido de grasa y de azcar. [place]ones[place] sin grasa o con bajo contenido de [place]. Dulces y postres [place] sin azcar o con bajo contenido de [place]. Helados y otros dulces congelados sin azcar o con bajo contenido de [place]. Grasas y Medical laboratory scientific officer. Nueces. Aceite de oliva. Los artculos mencionados arriba pueden no ser [employer] de las bebidas o los alimentos recomendados. Comunquese con el nutricionista para conocer ms opciones. QU ALIMENTOS NO SE RECOMIENDAN? Cereales Productos a base de [place] y de [place], como panes, pastas, bocadillos y cereales. Bebidas Bebidas azucaradas, como t helado y gaseosas con Location manager. Dulces y postres Productos de [place], [place] tortas, [place], [place], Museum/gallery exhibitions officer y tarta de [place]. Los artculos mencionados arriba pueden no ser [employer] de las bebidas y los alimentos que se Higher education careers adviser.  Comunquese con el nutricionista para obtener ms informacin. Esta informacin no tiene Marine scientist el consejo del mdico. Asegrese de hacerle al mdico cualquier pregunta que tenga. Document Released: 01/24/2015 Document Revised: 01/24/2015 Document Reviewed: 05/31/2014 Elsevier Interactive Patient Education  2017 [employer].

## 2016-12-29 NOTE — Progress Notes (Signed)
Subjective:  Patient ID: Tiffany Vazquez, female    DOB: 1970-06-03  Age: 46 y.o. MRN: [id number]  CC: headache, LE paresthesia  HPI Tiffany Vazquez is a 46 y.o. female with a PMH of asthma, bronchitis, rosacea, and labyrinthitis presents with occasional fatigue and paresthesia of the LE bilaterally. Began to take B12 with increase in energy but no change in LE paresthesia. Reports family hx of DM.     Also complains of LBP. Onset "a few weeks ago". Mostly felt at night when laying on right side. Does not endorse pain at other times. Does not endorse limitation in aROM. Sometimes feels pain in the right thigh but does not know if this is associated to LBP.    Also complains of right sided headache on occasion. Attributed to stress at work. No photophobia, no phonophobia, no nausea. Relieved with OTC NSAID. Does not endorse any other symptoms or complaints.   Outpatient Medications Prior to Visit  Medication Sig Dispense Refill  . DiphenhydrAMINE HCl (BENADRYL PO) Take by mouth.    . elvitegravir-cobicistat-emtricitabine-tenofovir (STRIBILD) 150-150-200-300 MG TABS tablet Take 1 tablet by mouth daily with breakfast. (Patient not taking: Reported on 10/07/2016) 30 tablet 0  . Fexofenadine HCl (ALLEGRA PO) Take by mouth daily.    . meclizine (ANTIVERT) 25 MG tablet Take 1 tablet (25 mg total) by mouth 3 (three) times daily as needed for dizziness. 30 tablet 0  . ondansetron (ZOFRAN ODT) 4 MG disintegrating tablet Take 1 tablet (4 mg total) by mouth every 8 (eight) hours as needed for nausea or vomiting. (Patient not taking: Reported on 10/07/2016) 20 tablet 0  . phenazopyridine (PYRIDIUM) 200 MG tablet Take 1 tablet (200 mg total) by mouth 3 (three) times daily. (Patient not taking: Reported on 10/07/2016) 6 tablet 0  . predniSONE (STERAPRED UNI-PAK 21 TAB) 10 MG (21) TBPK tablet Dispense 12 day pack. Take as directed with food. (Patient not taking: Reported on 10/07/2016) 42 tablet 0    No facility-administered medications prior to visit.      ROS Review of Systems  Constitutional: Negative for chills, fever and malaise/fatigue.  Eyes: Negative for blurred vision.  Respiratory: Negative for shortness of breath.   Cardiovascular: Negative for chest pain and palpitations.  Gastrointestinal: Negative for abdominal pain and nausea.  Genitourinary: Negative for dysuria and hematuria.  Musculoskeletal: Negative for joint pain and myalgias.  Skin: Negative for rash.  Neurological: Negative for tingling and headaches.       Right sided loss of audition  Psychiatric/Behavioral: Negative for depression. The patient is not nervous/anxious.     Objective:  BP 109/71 (BP Location: Left Arm, Patient Position: Sitting, Cuff Size: Large)   Pulse 69   Temp 98 F (36.7 C) (Oral)   Wt 185 lb 6.4 oz (84.1 kg)   LMP 12/20/2016 (Exact Date)   SpO2 94%   BMI 33.37 kg/m   BP/Weight 12/29/2016 10/07/2016 3/66/4403  Systolic BP 474 259 563  Diastolic BP 71 70 57  Wt. (Lbs) 185.4 185.4 -  BMI 33.37 33.37 -      Physical Exam  Constitutional: She is oriented to person, place, and time.  Well developed, overweight, NAD, polite  HENT:  Head: Normocephalic and atraumatic.  Eyes: No scleral icterus.  Neck: Normal range of motion. Neck supple. No thyromegaly present.  Cardiovascular: Normal rate, regular rhythm and normal heart sounds.   Pulmonary/Chest: Effort normal and breath sounds normal. No respiratory distress.  Musculoskeletal: She exhibits no edema.  Neurological: She is alert and oriented to person, place, and time. No cranial nerve deficit. Coordination normal.  Skin: Skin is warm and dry. No rash noted. No erythema. No pallor.  Psychiatric: She has a normal mood and affect. Her behavior is normal. Thought content normal.  Vitals reviewed.    Assessment & Plan:   1. Paresthesia of bilateral legs - HgB A1c 5.9% - Comprehensive metabolic panel - CBC with  Differential - TSH  2. Acute bilateral low back pain without sciatica - cyclobenzaprine (FLEXERIL) 10 MG tablet; Take 1 tablet (10 mg total) by mouth 3 (three) times daily as needed for muscle spasms.  Dispense: 30 tablet; Refill: 0 - naproxen (NAPROSYN) 500 MG tablet; Take 1 tablet (500 mg total) by mouth 2 (two) times daily with a meal.  Dispense: 30 tablet; Refill: 0 - Urinalysis Dipstick unremarkable in clinic   3. Episodic tension-type headache, not intractable - Begin cyclobenzaprine - Begin Naproxen   Meds ordered this encounter  Medications  . cyclobenzaprine (FLEXERIL) 10 MG tablet    Sig: Take 1 tablet (10 mg total) by mouth 3 (three) times daily as needed for muscle spasms.    Dispense:  30 tablet    Refill:  0    Order Specific Question:   Supervising Provider    Answer:   Tresa Garter [id number]  . naproxen (NAPROSYN) 500 MG tablet    Sig: Take 1 tablet (500 mg total) by mouth 2 (two) times daily with a meal.    Dispense:  30 tablet    Refill:  0    Order Specific Question:   Supervising Provider    Answer:   Tresa Garter [id number]    Follow-up: 6  Months. Clent Demark PA

## 2016-12-29 NOTE — Progress Notes (Signed)
Pt says sometimes her legs have a burning sensation

## 2016-12-30 LAB — CBC WITH DIFFERENTIAL/PLATELET
Basophils Absolute: 0 10*3/uL (ref 0.0–0.2)
Basos: 1 %
EOS (ABSOLUTE): 0.1 10*3/uL (ref 0.0–0.4)
Eos: 2 %
Hematocrit: 40.8 % (ref 34.0–46.6)
Hemoglobin: 13.8 g/dL (ref 11.1–15.9)
IMMATURE GRANULOCYTES: 0 %
Immature Grans (Abs): 0 10*3/uL (ref 0.0–0.1)
Lymphocytes Absolute: 1.9 10*3/uL (ref 0.7–3.1)
Lymphs: 28 %
MCH: 31.8 pg (ref 26.6–33.0)
MCHC: 33.8 g/dL (ref 31.5–35.7)
MCV: 94 fL (ref 79–97)
MONOS ABS: 0.5 10*3/uL (ref 0.1–0.9)
Monocytes: 7 %
NEUTROS PCT: 62 %
Neutrophils Absolute: 4.1 10*3/uL (ref 1.4–7.0)
PLATELETS: 272 10*3/uL (ref 150–379)
RBC: 4.34 x10E6/uL (ref 3.77–5.28)
RDW: 13.7 % (ref 12.3–15.4)
WBC: 6.6 10*3/uL (ref 3.4–10.8)

## 2016-12-30 LAB — COMPREHENSIVE METABOLIC PANEL
A/G RATIO: 1.4 (ref 1.2–2.2)
ALK PHOS: 64 IU/L (ref 39–117)
ALT: 26 IU/L (ref 0–32)
AST: 19 IU/L (ref 0–40)
Albumin: 4.2 g/dL (ref 3.5–5.5)
BUN/Creatinine Ratio: 22 (ref 9–23)
BUN: 17 mg/dL (ref 6–24)
Bilirubin Total: <0.2 mg/dL (ref 0.0–1.2)
CO2: 23 mmol/L (ref 20–29)
Calcium: 9.2 mg/dL (ref 8.7–10.2)
Chloride: 103 mmol/L (ref 96–106)
Creatinine, Ser: 0.77 mg/dL (ref 0.57–1.00)
GFR calc Af Amer: 108 mL/min/{1.73_m2} (ref >59)
GFR calc non Af Amer: 94 mL/min/{1.73_m2} (ref >59)
Globulin, Total: 3.1 g/dL (ref 1.5–4.5)
Glucose: 95 mg/dL (ref 65–99)
POTASSIUM: 4.3 mmol/L (ref 3.5–5.2)
Sodium: 140 mmol/L (ref 134–144)
Total Protein: 7.3 g/dL (ref 6.0–8.5)

## 2016-12-30 LAB — TSH: TSH: 1.94 u[IU]/mL (ref 0.450–4.500)

## 2017-02-02 ENCOUNTER — Encounter (HOSPITAL_COMMUNITY): Payer: Self-pay | Admitting: Emergency Medicine

## 2017-02-02 ENCOUNTER — Ambulatory Visit (HOSPITAL_COMMUNITY)
Admission: EM | Admit: 2017-02-02 | Discharge: 2017-02-02 | Disposition: A | Discharge status: Home / Self Care | Payer: No Typology Code available for payment source | Attending: Family Medicine | Admitting: Family Medicine

## 2017-02-02 DIAGNOSIS — H6983 Other specified disorders of Eustachian tube, bilateral: Secondary | ICD-10-CM

## 2017-02-02 DIAGNOSIS — H9122 Sudden idiopathic hearing loss, left ear: Secondary | ICD-10-CM

## 2017-02-02 MED ORDER — ONDANSETRON HCL 4 MG PO TABS: 4.0000 mg | ORAL_TABLET | Freq: Four times a day (QID) | ORAL | 0 refills | Status: DC

## 2017-02-02 NOTE — ED Triage Notes (Addendum)
Patient has hearing aid in right ear, but speaks of not being able to hear in left ear.  Reports hearing loss in left ear yesterday.  No pain.  Reports dizziness last night and vomiting

## 2017-02-02 NOTE — ED Provider Notes (Signed)
[place]    CSN: [id number] Arrival date & time: 02/02/17  1004     History   Chief Complaint Chief Complaint  Patient presents with  . Ear Problem    HPI Tiffany Vazquez is a 46 y.o. female.   46 year old spending female who is able to communicate with sufficient English to provide history as well as answer questions. She states that yesterday she had a sudden decrease in hearing in the left ear. He said chronic decreased hearing in the right ear which has been getting worse. She has been seen this year twice for similar problems and associated with dizziness that started yesterday. The dizziness was associated with an episode of nausea and vomiting is episodic and exacerbated by rapid head movements. She has a hearing aid in the right ear, despite with the hearing aid she continues to have decreased hearing on the right.      Past Medical History:  Diagnosis Date  . Asthma   . Bronchitis   . Rosacea     Patient Active Problem List   Diagnosis Date Noted  . Pruritus 12/25/2014  . Rosacea 10/21/2014  . SAB (spontaneous abortion) 04/21/2013  . Vaginal itching 10/25/2012    Past Surgical History:  Procedure Laterality Date  . CESAREAN SECTION      OB History    Gravida Para Term Preterm AB Living   5 3 2 1 2 3    SAB TAB Ectopic Multiple Live Births   2               Home Medications    Prior to Admission medications   Medication Sig Start Date End Date Taking? Authorizing Provider  ondansetron (ZOFRAN) 4 MG tablet Take 1 tablet (4 mg total) by mouth every 6 (six) hours. Prn N or V 02/02/17   Janne Napoleon, NP    Family History Family History  Problem Relation Age of Onset  . Diabetes Mother   . Allergic Disorder Mother   . Diabetes Father     Social History Social History  Substance Use Topics  . Smoking status: Never Smoker  . Smokeless tobacco: Never Used  . Alcohol use No     Allergies   Other   Review of  Systems Review of Systems  Constitutional: Positive for activity change. Negative for fever.  HENT: Positive for hearing loss. Negative for congestion, dental problem, ear discharge, ear pain, postnasal drip, sinus pain, sinus pressure, sneezing and sore throat.   Respiratory: Negative.   Gastrointestinal: Negative for abdominal pain.       As per history of present illness  Musculoskeletal: Negative.   Skin: Negative.   Neurological: Positive for dizziness. Negative for seizures, syncope, speech difficulty, light-headedness and headaches.  Psychiatric/Behavioral: Negative.   All other systems reviewed and are negative.    Physical Exam Triage Vital Signs ED Triage Vitals [02/02/17 1021]  Enc Vitals Group     BP 129/84     Pulse Rate 62     Resp 18     Temp 98.1 F (36.7 C)     Temp Source Oral     SpO2 97 %     Weight      Height      Head Circumference      Peak Flow      Pain Score      Pain Loc      Pain Edu?      Excl. in [place]?  No data found.   Updated Vital Signs BP 129/84 (BP Location: Left Arm)   Pulse 62   Temp 98.1 F (36.7 C) (Oral)   Resp 18   SpO2 97%   Visual Acuity Right Eye Distance:   Left Eye Distance:   Bilateral Distance:    Right Eye Near:   Left Eye Near:    Bilateral Near:     Physical Exam  Constitutional: She is oriented to person, place, and time. She appears well-developed and well-nourished. No distress.  HENT:  Head: Normocephalic and atraumatic.  Mouth/Throat: No oropharyngeal exudate.  Left TM retracted. Light reflex normal. Slight opacity in the inferior portion of the TM, equivocal fluid in that space. Right TM also retracted minor erythema over the umbo only. No effusion is seen. Bilateral EACs are clear. No signs of infection.  Oropharynx with streaky erythema in the mid posterior pharynx. Currently no drainage seen.  Eyes: EOM are normal.  Neck: Normal range of motion. Neck supple.  Cardiovascular: Normal rate.    Pulmonary/Chest: Effort normal.  Musculoskeletal: Normal range of motion.  Lymphadenopathy:    She has no cervical adenopathy.  Neurological: She is alert and oriented to person, place, and time.  Skin: Skin is warm and dry. Capillary refill takes less than 2 seconds.  Nursing note and vitals reviewed.    UC Treatments / Results  Labs (all labs ordered are listed, but only abnormal results are displayed) Labs Reviewed - No data to display  EKG  EKG Interpretation None       Radiology No results found.  Procedures Procedures (including critical care time)  Medications Ordered in UC Medications - No data to display   Initial Impression / Assessment and Plan / UC Course  I have reviewed the triage vital signs and the nursing notes.  Pertinent labs & imaging results that were available during my care of the patient were reviewed by me and considered in my medical decision making (see chart for details).     It appears that she has some drainage in the back of your throat. Recommend taking Allegra or Zyrtec daily to minimize drainage. You may also try Sudafed PE 10 mg every 4-6 hours as needed to help with congestion. If these medications do not have been within 5-7 days call your primary care provider for an appointment and may need to see the ear specialist.   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 Prn N or V     Controlled Substance Prescriptions [place] Controlled Substance Registry consulted? Not Applicable   Janne Napoleon, NP 02/02/17 1049

## 2017-02-02 NOTE — Discharge Instructions (Signed)
It appears that she has some drainage in the back of your throat. Recommend taking Allegra or Zyrtec daily to minimize drainage. You may also try Sudafed PE 10 mg every 4-6 hours as needed to help with congestion. If these medications do not have been within 5-7 days call your primary care provider for an appointment and may need to see the ear specialist.

## 2017-02-04 ENCOUNTER — Inpatient Hospital Stay (INDEPENDENT_AMBULATORY_CARE_PROVIDER_SITE_OTHER): Payer: Self-pay | Admitting: Physician Assistant

## 2017-02-10 DIAGNOSIS — M2669 Other specified disorders of temporomandibular joint: Secondary | ICD-10-CM | POA: Insufficient documentation

## 2017-02-10 DIAGNOSIS — H903 Sensorineural hearing loss, bilateral: Secondary | ICD-10-CM | POA: Insufficient documentation

## 2017-02-10 DIAGNOSIS — H8302 Labyrinthitis, left ear: Secondary | ICD-10-CM | POA: Insufficient documentation

## 2017-02-24 ENCOUNTER — Other Ambulatory Visit: Payer: Self-pay | Admitting: Physician Assistant

## 2017-02-24 DIAGNOSIS — H905 Unspecified sensorineural hearing loss: Secondary | ICD-10-CM

## 2017-02-24 DIAGNOSIS — H9313 Tinnitus, bilateral: Secondary | ICD-10-CM | POA: Insufficient documentation

## 2017-03-05 IMAGING — DX DG CHEST 2V
2 series · 2 of 2 positions shown · non-contrast
Comparison: none

[chest pa]
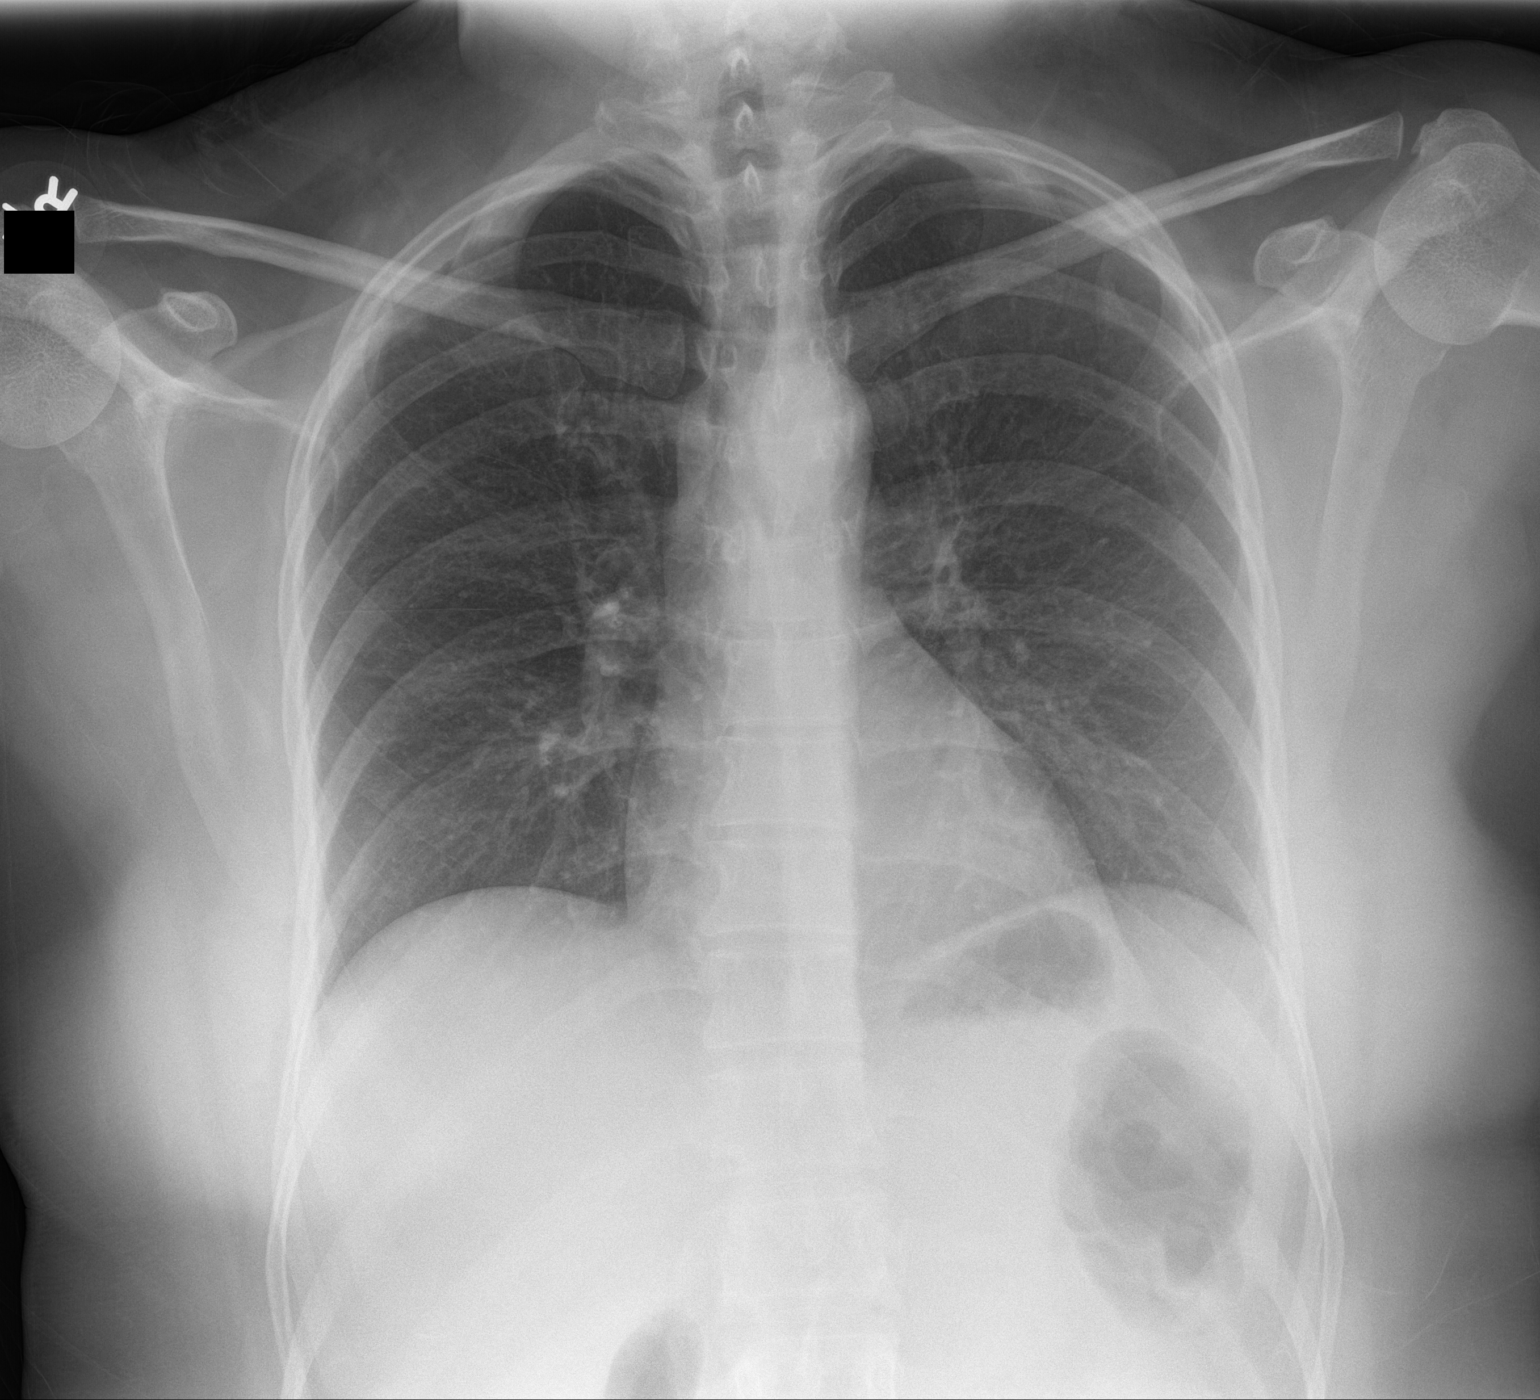

[chest lat]
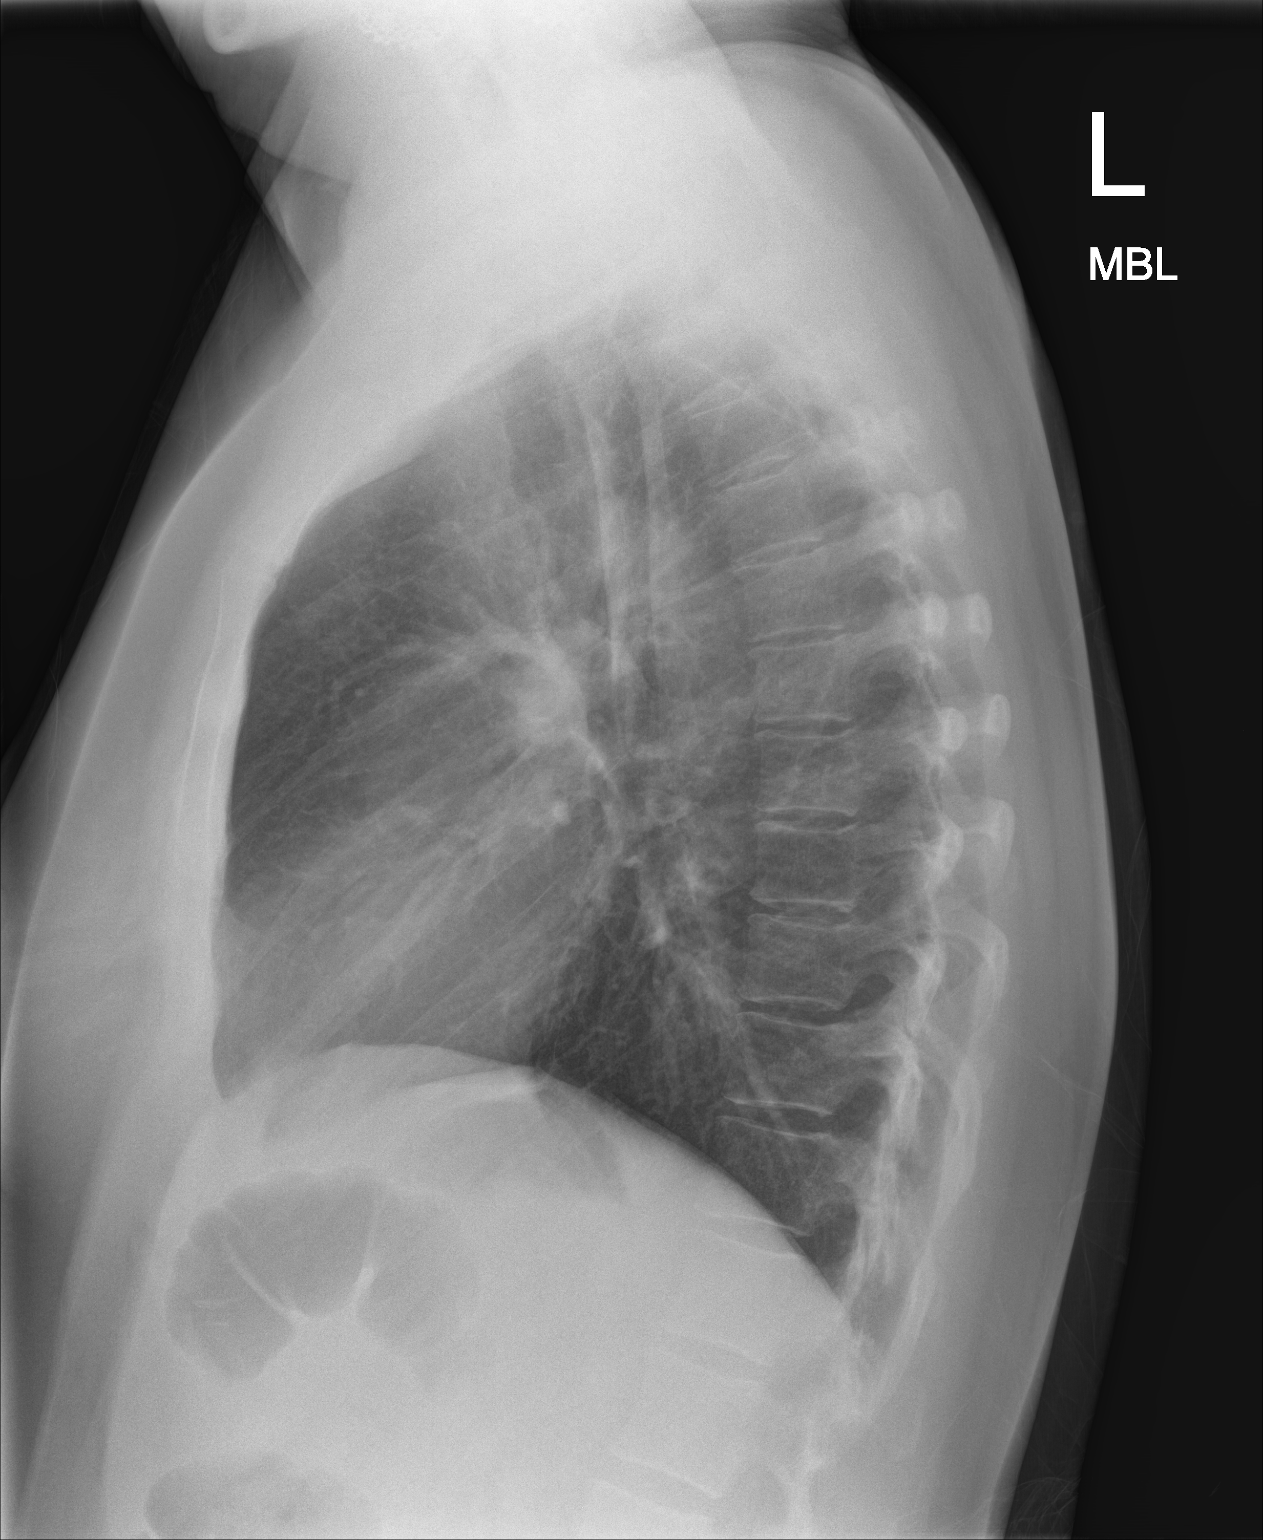

[2 of 2 positions shown; findings below may reference images not displayed]

Canned report from images found in remote index.

Refer to host system for actual result text.

## 2017-03-11 ENCOUNTER — Ambulatory Visit
Admission: RE | Admit: 2017-03-11 | Discharge: 2017-03-11 | Disposition: A | Discharge status: Home / Self Care | Payer: No Typology Code available for payment source | Source: Ambulatory Visit | Attending: Physician Assistant | Admitting: Physician Assistant

## 2017-03-11 DIAGNOSIS — H905 Unspecified sensorineural hearing loss: Secondary | ICD-10-CM

## 2017-03-11 MED ORDER — GADOBENATE DIMEGLUMINE 529 MG/ML IV SOLN: 16.0000 mL | Freq: Once | INTRAVENOUS | Status: DC | PRN

## 2019-01-28 ENCOUNTER — Other Ambulatory Visit (HOSPITAL_COMMUNITY): Payer: Self-pay | Admitting: *Deleted

## 2019-01-28 DIAGNOSIS — N6310 Unspecified lump in the right breast, unspecified quadrant: Secondary | ICD-10-CM

## 2019-02-01 ENCOUNTER — Ambulatory Visit (HOSPITAL_COMMUNITY)
Admission: RE | Admit: 2019-02-01 | Discharge: 2019-02-01 | Disposition: A | Discharge status: Home / Self Care | Payer: No Typology Code available for payment source | Source: Ambulatory Visit | Attending: Obstetrics and Gynecology | Admitting: Obstetrics and Gynecology

## 2019-02-01 ENCOUNTER — Other Ambulatory Visit: Payer: Self-pay

## 2019-02-01 ENCOUNTER — Encounter (HOSPITAL_COMMUNITY): Payer: Self-pay

## 2019-02-01 DIAGNOSIS — N6310 Unspecified lump in the right breast, unspecified quadrant: Secondary | ICD-10-CM

## 2019-02-01 DIAGNOSIS — Z01419 Encounter for gynecological examination (general) (routine) without abnormal findings: Secondary | ICD-10-CM

## 2019-02-01 NOTE — Progress Notes (Signed)
Complaints of right breast lump for over 3 weeks that is painful when touched. Patient rates the pain at a 1 out of 10.  Pap Smear: Pap smear completed today. Last Pap smear was 10/30/2016 at [facility] and normal with negative HPV. Per patient has no history of an abnormal Pap smear. Last two Pap smear results are in Epic.  Physical exam: Breasts Breasts symmetrical. No skin abnormalities bilateral breasts. No nipple retraction bilateral breasts. No nipple discharge bilateral breasts. No lymphadenopathy. No lumps palpated left breast. Palpated a lump within the right breast next to the areola between 1 o'clock and 11 o'clock. No complaints of pain or tenderness on exam. Referred patient to the [place] for a diagnostic mammogram and right breast ultrasound. Appointment scheduled for Monday, February 07, 2019 at 0920.        Pelvic/Bimanual   Ext Genitalia No lesions, no swelling and no discharge observed on external genitalia.         Vagina Vagina pink and normal texture. No lesions or discharge observed in vagina.          Cervix Cervix is present. Cervix pink and of normal texture. No discharge observed.     Uterus Uterus is present and palpable. Uterus in normal position and normal size.        Adnexae Bilateral ovaries present and palpable. No tenderness on palpation.         Rectovaginal No rectal exam completed today since patient had no rectal complaints. No skin abnormalities observed on exam.    Smoking History: Patient has never smoked.  Patient Navigation: Patient education provided. Access to services provided for patient through [facility] program. Spanish interpreter provided.   Breast and Cervical Cancer Risk Assessment: Patient has no family history of breast cancer, known genetic mutations, or radiation treatment to the chest before age 61. Patient has no history of cervical dysplasia, immunocompromised, or DES exposure in-utero.  Risk Assessment     Risk Scores      02/01/2019   Last edited by: Armond Hang, LPN   5-year risk: 0.6 %   Lifetime risk: 6.5 %         Used Spanish interpreter Rudene Anda from [employer].

## 2019-02-01 NOTE — Patient Instructions (Signed)
Explained breast self awareness with [name]. Let patient know [facility] will cover Pap smears and HPV typing every 5 years unless has a history of abnormal Pap smears. Referred patient to the [place] for a diagnostic mammogram and right breast ultrasound. Appointment scheduled for Monday, February 07, 2019 at 0920. Patient aware of appointment and will be there. Let patient know will follow up with her within the next couple weeks with results of Pap smear by letter or phone. [name] verbalized understanding.  [place], Arvil Chaco, RN 12:01 PM

## 2019-02-02 ENCOUNTER — Encounter (HOSPITAL_COMMUNITY): Payer: Self-pay | Admitting: *Deleted

## 2019-02-06 ENCOUNTER — Other Ambulatory Visit (HOSPITAL_COMMUNITY): Payer: Self-pay | Admitting: *Deleted

## 2019-02-06 LAB — CYTOLOGY - PAP
Diagnosis: NEGATIVE
High risk HPV: NEGATIVE
Molecular Disclaimer: 56
Molecular Disclaimer: DETECTED
Molecular Disclaimer: NORMAL

## 2019-02-07 ENCOUNTER — Other Ambulatory Visit (HOSPITAL_COMMUNITY): Payer: Self-pay | Admitting: Obstetrics and Gynecology

## 2019-02-07 ENCOUNTER — Ambulatory Visit
Admission: RE | Admit: 2019-02-07 | Discharge: 2019-02-07 | Disposition: A | Discharge status: Home / Self Care | Payer: No Typology Code available for payment source | Source: Ambulatory Visit | Attending: Obstetrics and Gynecology | Admitting: Obstetrics and Gynecology

## 2019-02-07 ENCOUNTER — Other Ambulatory Visit: Payer: Self-pay

## 2019-02-07 DIAGNOSIS — N6310 Unspecified lump in the right breast, unspecified quadrant: Secondary | ICD-10-CM

## 2019-02-16 ENCOUNTER — Other Ambulatory Visit: Payer: Self-pay

## 2019-02-16 ENCOUNTER — Inpatient Hospital Stay: Payer: Self-pay | Attending: Obstetrics and Gynecology | Admitting: *Deleted

## 2019-02-16 ENCOUNTER — Other Ambulatory Visit: Payer: Self-pay | Admitting: Obstetrics and Gynecology

## 2019-02-16 VITALS — BP 115/86 | Temp 97.5°F | Ht 63.5 in | Wt 194.5 lb

## 2019-02-16 DIAGNOSIS — Z0000 Encounter for general adult medical examination without abnormal findings: Secondary | ICD-10-CM

## 2019-02-16 NOTE — Progress Notes (Signed)
Wisewoman initial screening   interpreter- Rudene Anda, [employer]   Clinical Measurement:  Height: 63.5 in Weight: 194.5lb  Blood Pressure: 142/84  Blood Pressure #2: 115/56 Fasting Labs Drawn Today, will review with patient when they result.   Medical History:  Patient states that she does not have a history of high cholesterol, high blood pressure or diabetes.  Medications:  Patient states that she does not take  medication to lower cholesterol, blood pressure or blood sugar.  Patient does not take an aspirin a day to help prevent a heart attack or stroke.  Blood pressure, self measurement: Patient states that she does not measure blood pressure from home and has not been told to do so by a healthcare provider   Nutrition: Patient states that on average she eats 1 cup of fruit and 1 cup of vegetables per day. Patient states that she does not eat fish at least 2 times per week. Patient eats less than half servings of whole grains. Patient drinks less than 36 ounces of beverages with added sugar weekly. Patient is not currently watching sodium or salt intake. In the past 7 days patient has had drinks containing alcohol on one day. On an average day when patient consumes alcoholic beverages she consumes one drink,     Physical activity:  Patient states that she gets 1500 minutes of moderate and 0 minutes of vigorous physical activity each week.  Smoking status:  Patient states that she has never smoked tobacco.   Quality of life:  Over the past 2 weeks patient states that she has not had any days where she has little interest or pleasure in doing things and 0 days where she has felt down, depressed or hopeless.    Risk reduction and counseling:    Health Coaching: Encouraged patient to try and increase the amount of fruits and vegetables that she consumes on a daily basis. Explained that the daily recommendation is 2 cups of fruit and 3 cups of vegetables. Also gave suggestion to try and add  heart healthy fish to diet, like salmon, tuna or mackerel.Spoke with patient about trying to watch how much salt that she consumes and cooks with.   Navigation:  I will notify patient of lab results.  Patient is aware of 2 more health coaching sessions and a follow up.  Time: 25 minutes

## 2019-02-17 LAB — LIPID PANEL W/O CHOL/HDL RATIO
Cholesterol, Total: 205 mg/dL — ABNORMAL HIGH (ref 100–199)
HDL: 55 mg/dL (ref >39)
LDL Chol Calc (NIH): 126 mg/dL — ABNORMAL HIGH (ref 0–99)
Triglycerides: 134 mg/dL (ref 0–149)
VLDL Cholesterol Cal: 24 mg/dL (ref 5–40)

## 2019-02-17 LAB — HGB A1C W/O EAG: Hgb A1c MFr Bld: 6.2 % — ABNORMAL HIGH (ref 4.8–5.6)

## 2019-02-17 LAB — GLUCOSE, RANDOM: Glucose: 105 mg/dL — ABNORMAL HIGH (ref 65–99)

## 2019-02-18 ENCOUNTER — Telehealth: Payer: Self-pay

## 2019-02-18 NOTE — Telephone Encounter (Signed)
Left message with patient via [place] [phone]. Stated that I was calling with the [facility] program to go over her lab results and that I would try and call again on Monday.

## 2019-02-21 ENCOUNTER — Telehealth: Payer: Self-pay

## 2019-02-21 NOTE — Telephone Encounter (Signed)
Health coaching 2   interpreter- Marlana Latus, [employer]   Labs- 205 cholesterol , 126 LDL cholesterol , 134 triglycerides , 55 HDL cholesterol , 6.2 hemoglobin A1C , 105 mean plasma glucose  Patient understands and is aware of her lab results.   Goals-  Informed patient of lab results. Answered any questions patient had regarding results.  Increase the amount of fruits and vegetables consumed. 2 servings of fruit and 2 servings of vegetables a day. Reduce the amount of fried and fatty foods consumed. Add in healthy fats like olive oil, avocados and nuts. Reduce the amount of red meats and whole fat dairy products consumed. Reduce the amount of sweets and sugars consumed in both food and drinks, as well as the amount of carbs consumed.   Navigation:  Patient is aware of 1 more health coaching sessions and a follow up. Will call patient with follow-up appointment information for elevated labs.  Time- 11 minutes

## 2019-02-22 ENCOUNTER — Telehealth (HOSPITAL_COMMUNITY): Payer: Self-pay

## 2019-02-22 NOTE — Telephone Encounter (Signed)
Called patient via interpreter Eyvonne Mechanic to give [facility] follow-up appointment info. Patient is scheduled with Internal Medicine on October 13th @9 :45 am. Left name and number for patient to call back if she has questions.

## 2019-02-23 DIAGNOSIS — R7309 Other abnormal glucose: Secondary | ICD-10-CM

## 2019-02-23 DIAGNOSIS — R7301 Impaired fasting glucose: Secondary | ICD-10-CM

## 2019-02-23 DIAGNOSIS — E7800 Pure hypercholesterolemia, unspecified: Secondary | ICD-10-CM

## 2019-02-28 ENCOUNTER — Telehealth (HOSPITAL_COMMUNITY): Payer: Self-pay | Admitting: *Deleted

## 2019-02-28 NOTE — Telephone Encounter (Signed)
Called patient with Spanish interpreter Benjamine Sprague and let her know that her Pap smear was normal with negative HPV. Informed patient that her next Pap smear is due in 5-years. Patient verbalized understanding.

## 2019-03-01 ENCOUNTER — Ambulatory Visit (INDEPENDENT_AMBULATORY_CARE_PROVIDER_SITE_OTHER): Payer: Self-pay | Admitting: Internal Medicine

## 2019-03-01 ENCOUNTER — Encounter: Payer: Self-pay | Admitting: Internal Medicine

## 2019-03-01 ENCOUNTER — Other Ambulatory Visit: Payer: Self-pay

## 2019-03-01 DIAGNOSIS — E785 Hyperlipidemia, unspecified: Secondary | ICD-10-CM | POA: Insufficient documentation

## 2019-03-01 DIAGNOSIS — M26609 Unspecified temporomandibular joint disorder, unspecified side: Secondary | ICD-10-CM

## 2019-03-01 DIAGNOSIS — R7303 Prediabetes: Secondary | ICD-10-CM

## 2019-03-01 DIAGNOSIS — K219 Gastro-esophageal reflux disease without esophagitis: Secondary | ICD-10-CM

## 2019-03-01 DIAGNOSIS — Z23 Encounter for immunization: Secondary | ICD-10-CM

## 2019-03-01 DIAGNOSIS — H9313 Tinnitus, bilateral: Secondary | ICD-10-CM

## 2019-03-01 DIAGNOSIS — Z79899 Other long term (current) drug therapy: Secondary | ICD-10-CM

## 2019-03-01 DIAGNOSIS — H9319 Tinnitus, unspecified ear: Secondary | ICD-10-CM

## 2019-03-01 DIAGNOSIS — E782 Mixed hyperlipidemia: Secondary | ICD-10-CM

## 2019-03-01 NOTE — Assessment & Plan Note (Signed)
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 She states this has been improving. She is to follow up for worsening symptoms. - Continue to monitor

## 2019-03-01 NOTE — Assessment & Plan Note (Signed)
Patient report some TMJ pain for 15 or so years with chewing. She states it is intermittent and improved with tylenol. - Continue tylenol as needed

## 2019-03-01 NOTE — Progress Notes (Signed)
CC: Pre-Daibetes, High Cholesterol, Tinnitus, TMJ dysfunction, Allergies  HPI:   [name] is a 48 y.o. F with PMHx listed below presenting for Pre-Daibetes, High Cholesterol, Tinnitus, TMJ dysfunction, Allergies. Please see the A&P for the status of the patient's chronic medical problems.  Past Medical History:  Diagnosis Date  . Allergy   . Asthma   . Bronchitis   . GERD (gastroesophageal reflux disease)   . Rosacea   . Rosacea 10/21/2014  . Vaginal itching 10/25/2012   Past Surgical History:  Procedure Laterality Date  . CESAREAN SECTION     2 previous  . DILATION AND CURETTAGE OF UTERUS     Family History  Problem Relation Age of Onset  . Diabetes Mother   . Allergic Disorder Mother   . Diabetes Sister   . Diabetes Brother   . Diabetes Brother   . Diabetes Sister   . Diabetes Sister   . Diabetes Sister   . Diabetes Sister    Social History   Socioeconomic History  . Marital status: Married    Spouse name: Not on file  . Number of children: 3  . Years of education: Not on file  . Highest education level: 8th grade  Occupational History  . Not on file  Social Needs  . Financial resource strain: Not on file  . Food insecurity    Worry: Not on file    Inability: Not on file  . Transportation needs    Medical: No    Non-medical: No  Tobacco Use  . Smoking status: Never Smoker  . Smokeless tobacco: Never Used  Substance and Sexual Activity  . Alcohol use: Yes    Comment: rarely  . Drug use: No  . Sexual activity: Yes    Birth control/protection: Condom  Lifestyle  . Physical activity    Days per week: Not on file    Minutes per session: Not on file  . Stress: Not on file  Relationships  . Social Herbalist on phone: Not on file    Gets together: Not on file    Attends religious service: Not on file    Active member of club or organization: Not on file    Attends meetings of clubs or organizations: Not on file   Relationship status: Not on file  . Intimate partner violence    Fear of current or ex partner: Not on file    Emotionally abused: Not on file    Physically abused: Not on file    Forced sexual activity: Not on file  Other Topics Concern  . Not on file  Social History Narrative  . Not on file   Review of Systems:  Performed and all others negative.  Physical Exam:  Vitals:   03/01/19 0946  BP: (!) 114/37  Pulse: 62  Temp: 98.2 F (36.8 C)  TempSrc: Oral  SpO2: 100%  Weight: 189 lb 11.2 oz (86 kg)  Height: 5\' 3"  (1.6 m)   Physical Exam Constitutional:      General: She is not in acute distress.    Appearance: Normal appearance.  Cardiovascular:     Rate and Rhythm: Normal rate and regular rhythm.     Pulses: Normal pulses.     Heart sounds: Normal heart sounds.  Pulmonary:     Effort: Pulmonary effort is normal. No respiratory distress.     Breath sounds: Normal breath sounds.  Abdominal:     General: Bowel sounds  are normal. There is no distension.     Palpations: Abdomen is soft.     Tenderness: There is no abdominal tenderness.  Musculoskeletal:        General: No swelling or deformity.  Skin:    General: Skin is warm and dry.  Neurological:     General: No focal deficit present.     Mental Status: Mental status is at baseline.    Assessment & Plan:   See Encounters Tab for problem based charting.  Patient discussed with Dr. Rebeca Alert

## 2019-03-01 NOTE — Patient Instructions (Addendum)
Thank you for allowing [place] to care for you  For your elevated cholesterol and pre-diabetes - Recommend a health diet and regular exercise  Flu shot given today  Follow up in about 1 year for check up and repeat labs   Traducido con el traductor de google  Gracias por permitirnos cuidar de usted  Para su colesterol elevado y prediabetes - Recomendar una dieta saludable y ejercicio regular.  Vacuna contra la gripe administrada hoy  Realice un seguimiento en aproximadamente 1 ao para chequeos y exmenes de laboratorio repetidos    Plan de alimentacin para personas con prediabetes Prediabetes Eating Plan La prediabetes es una afeccin que hace que los niveles de azcar en la sangre (glucosa) sean ms altos de lo normal. Esto aumenta el riesgo de tener diabetes. Para prevenir la diabetes, es posible que su mdico le recomiende cambios en la dieta y otros cambios en su estilo de vida que lo ayuden a Scientist, forensic lo siguiente:  Chief Technology Officer los niveles de glucemia.  Mejorar los niveles de [place].  Controlar la presin arterial. El mdico puede recomendarle que trabaje con un especialista en alimentacin y nutricin (nutricionista) para Higher education careers adviser de comidas ms conveniente para usted. Consejos para seguir este plan: Estilo de vida  Establezca metas para bajar de peso con la ayuda de su equipo de atencin mdica. A la [employer] con prediabetes se les recomienda bajar un 7% de su peso corporal.  Haga ejercicio al menos 55minutos 5das por semana, como mnimo.  Asista a un grupo de apoyo o solicite el apoyo continuo de un consejero de salud mental.  [employer] medicamentos de venta libre y los recetados solamente como se lo haya indicado el mdico. Leer las etiquetas de los alimentos  Lea las etiquetas de los alimentos envasados para controlar la cantidad de grasa, sal (sodio) y azcar que contienen. Evite los alimentos que contengan lo siguiente: ? Grasas  saturadas. ? Grasas trans. ? Azcares agregados.  Evite los alimentos que contengan ms de 344miligramos(mg) de sodio por porcin. Limite el consumo diario de sodio a menos de 2300mg  por Training and development officer. De compras  Evite comprar alimentos procesados y preelaborados. Coccin  Cocine con aceite de oliva. No use mantequilla, manteca de cerdo o [employer].  Cocine los alimentos al horno, a la parrilla, asados o hervidos. Evite frerlos. Planificacin de las comidas   Trabaje con el nutricionista para crear un plan de alimentacin que sea adecuado para usted. Esto puede incluir lo siguiente: ? Registro de la cantidad de caloras que ingiere. Use un registro de alimentos, un cuaderno o una aplicacin mvil para anotar lo que comi en cada comida. ? Uso del ndice glucmico (IG) para planificar las comidas. El ndice [place] con qu rapidez elevar la glucemia un alimento. Elija alimentos con bajo IG. Estos demoran ms en elevar la glucemia.  Considere la posibilidad de seguir Web designer. Esta dieta incluye lo siguiente: ? Varias porciones de frutas y verduras frescas por da. ? Pescado al [employer] veces por semana. ? Varias porciones de cereales integrales, frijoles, frutos secos y semillas por da. ? Aceite de Tour manager de otras grasas. ? Consumo moderado de alcohol. ? Pequeas cantidades de carnes rojas y lcteos enteros.  Si tiene hipertensin arterial, quizs Development worker, international aid consumo de sodio o seguir una dieta como el plan de alimentacin basado en Enfoques Alimentarios para Detener la Hipertensin (Dietary Approaches to Stop Hypertension, DASH). Este es un plan de alimentacin cuyo  objetivo es bajar la hipertensin arterial. Qu alimentos se recomiendan? Es posible que los alimentos incluidos a continuacin no Systems developer. Hable con el nutricionista sobre las mejores opciones alimenticias para usted. Cereales Productos integrales, como panes,  galletas, cereales y pastas de salvado o integrales. Avena sin azcar. Trigo burgol. Cebada. Quinua. Arroz integral. Tacos o tortillas de harina de maz o de salvado. [place] Valeda Malm. [place]. Guisantes. Remolachas. Coliflor. Repollo. Brcoli. Zanahorias. Tomates. [place]. [place]in Coupe. Hierbas. Pimienta. Cebollas. Pepinos. Repollitos de Bruselas. Frutas Frutos rojos. Bananas. Manzanas. Naranjas. Uvas. Papaya. Mango. [place]. Kiwi. Pomelo. Cerezas. Carnes y otros alimentos ricos en protenas Mariscos. Carne de ave sin piel. Cortes magros de cerdo y carne de res. Tofu. Huevos. Frutos secos. Frijoles. Lcteos Productos lcteos descremados o semidescremados, como yogur, queso cottage y [place]. [employer]. T. Caf. Gaseosas sin azcar o dietticas. Soda. Leche descremada o semidescremada. Productos alternativos a la [place], como [place] de soja o de [place]. Grasas y aceites Aceite de [place]. Aceite de canola. Aceite de girasol. Aceite de semillas de uva. [place]. Nueces. Dulces y postres Pudin sin azcar o con bajo contenido de [place]. Helado y otros postres congelados sin azcar o con bajo contenido de [place]. Condimentos y otros alimentos Hierbas. Especias sin sodio. Mostaza. Salsa de pepinillos. [place] con bajo contenido de [place] y de Location manager. Salsa barbacoa con bajo contenido de grasa y de azcar. Mayonesa con bajo contenido de grasa o sin grasa. Qu alimentos no se recomiendan? Es posible que los alimentos incluidos a continuacin no Systems developer. Hable con el nutricionista sobre las mejores opciones alimenticias para usted. Cereales Productos elaborados con [place] y [place], como panes, pastas, bocadillos y cereales. Verduras Verduras enlatadas. Verduras congeladas con mantequilla o salsa de crema. Lambert Mody Frutas enlatadas al almbar. Carnes y otros alimentos ricos en protenas Cortes de carne con grasa. Carne de ave con piel. Carne empanizada o frita.  Carnes procesadas. Lcteos Yogur, [place] enteros. Bebidas Bebidas azucaradas, como t helado dulce y [place]. Grasas y [employer]. [place]. Mantequilla clarificada. Dulces y [employer], como pasteles, pastelitos, galletas dulces y tarta de [place]. Condimentos y otros alimentos Mezclas de especias con sal agregada. [place]. Salsa barbacoa. Mayonesa. Resumen  Para prevenir la diabetes, es posible que Astronomer en la dieta y otros cambios en su estilo de vida para ayudar a Diplomatic Services operational officer en la sangre, mejorar los niveles de colesterol y Aeronautical engineer presin arterial.  Establezca metas para bajar de peso con la ayuda de su equipo de atencin mdica. A la [employer] con prediabetes se les recomienda bajar un 7por ciento de su peso corporal.  Considere la posibilidad de seguir una dieta mediterrnea que incluya muchas frutas y verduras frescas, cereales integrales, frijoles, frutos secos, semillas, pescado, carnes magras, lcteos descremados y aceites saludables. Esta informacin no tiene Marine scientist el consejo del mdico. Asegrese de hacerle al mdico cualquier pregunta que tenga. Document Released: 01/24/2015 Document Revised: 11/17/2016 Document Reviewed: 11/17/2016 Elsevier Patient Education  2020 [employer].

## 2019-03-01 NOTE — Assessment & Plan Note (Signed)
Patient noted to have mild hyperlipidemia on recent lab work. Lipid Panel     Component Value Date/Time   CHOL 205 (H) 02/16/2019 0925   TRIG 134 02/16/2019 0925   HDL 55 02/16/2019 0925   LDLCALC 126 (H) 02/16/2019 0925   LABVLDL 24 02/16/2019 0925  ASCVD risk is 0.8%. No indication for statin therapy at this time. - Recommend healthy diet and regular exercise - Continue to monitory

## 2019-03-01 NOTE — Assessment & Plan Note (Signed)
A1c on recent lab work is in the prediabetic range at 6.2. Will start with diet and exercise and recheck in 1 year. - Recommend healthy diet and decreased sugar intake - Regular exercise

## 2019-03-01 NOTE — Assessment & Plan Note (Addendum)
She reports mild Reflux symptoms that she take Pepcid or Tums for a few times a month. - Continue PRN antacids

## 2019-03-03 NOTE — Progress Notes (Signed)
Internal Medicine Clinic Attending  Case discussed with Dr. Melvin at the time of the visit.  We reviewed the resident's history and exam and pertinent patient test results.  I agree with the assessment, diagnosis, and plan of care documented in the resident's note.  Alexander Raines, M.D., Ph.D.  

## 2019-06-23 ENCOUNTER — Telehealth: Payer: Self-pay

## 2019-06-23 NOTE — Telephone Encounter (Signed)
Health Coaching 3  interpreter- Rudene Anda, [facility]   Goals- Patient stated that she has decreased the amount of sugar and bread that she consumes. Patient states that she has been walking for 30-40 minutes 3 times a week.    New goal- Continue watching the amount of sugars and carbs consumed. Decrease the amount of fried foods consumed. Increase exercise to 20-25 minutes a day to get the recommended 150 minutes a week. Increase the amount of fruits consumed to 2 cups a day and 3 cups of vegetables.  Barrier to reaching goal- none   Strategies to overcome- NA   Navigation:  Patient is aware of  a follow up session. Patient is scheduled for follow-up appointment on August 03, 2019 @ 8:00 am   Time- 10 minutes

## 2019-07-09 ENCOUNTER — Ambulatory Visit: Payer: No Typology Code available for payment source | Attending: Internal Medicine

## 2019-07-09 DIAGNOSIS — Z23 Encounter for immunization: Secondary | ICD-10-CM | POA: Insufficient documentation

## 2019-07-09 NOTE — Progress Notes (Signed)
   Covid-19 Vaccination Clinic  Name:  Tiffany Vazquez    MRN: [id number] DOB: 1970-11-01  07/09/2019  [name] was observed post Covid-19 immunization for 15 minutes without incidence. She was provided with Vaccine Information Sheet and instruction to access the V-Safe system.   [name] was instructed to call 911 with any severe reactions post vaccine: Marland Kitchen Difficulty breathing  . Swelling of your face and throat  . A fast heartbeat  . A bad rash all over your body  . Dizziness and weakness    Immunizations Administered    Name Date Dose VIS Date Route   Pfizer COVID-19 Vaccine 07/09/2019  8:37 AM 0.3 mL 04/29/2019 Intramuscular   Manufacturer: [place]   Lot: [id number]   Manuel Garcia: [id number]

## 2019-08-02 ENCOUNTER — Ambulatory Visit: Payer: Self-pay | Attending: Internal Medicine

## 2019-08-02 DIAGNOSIS — Z23 Encounter for immunization: Secondary | ICD-10-CM

## 2019-08-02 NOTE — Progress Notes (Signed)
   Covid-19 Vaccination Clinic  Name:  Tiffany Vazquez    MRN: [id number] DOB: 04-28-1971  08/02/2019  [name] was observed post Covid-19 immunization for 15 minutes without incident. She was provided with Vaccine Information Sheet and instruction to access the V-Safe system.   [name] was instructed to call 911 with any severe reactions post vaccine: Marland Kitchen Difficulty breathing  . Swelling of face and throat  . A fast heartbeat  . A bad rash all over body  . Dizziness and weakness   Immunizations Administered    Name Date Dose VIS Date Route   Pfizer COVID-19 Vaccine 08/02/2019  9:29 AM 0.3 mL 04/29/2019 Intramuscular   Manufacturer: [place]   Lot: [id number]   [place]: [id number]

## 2019-08-03 ENCOUNTER — Other Ambulatory Visit: Payer: Self-pay

## 2019-08-03 ENCOUNTER — Inpatient Hospital Stay: Payer: Self-pay | Attending: Obstetrics and Gynecology | Admitting: *Deleted

## 2019-08-03 VITALS — BP 116/80 | Temp 97.1°F | Ht 63.5 in | Wt 184.0 lb

## 2019-08-03 DIAGNOSIS — Z0000 Encounter for general adult medical examination without abnormal findings: Secondary | ICD-10-CM

## 2019-08-03 NOTE — Progress Notes (Signed)
[facility]isewoman follow up  Interpreter: Rudene Anda, [employer]   Clinical Measurement:  Height: 63.5 in Weight: 184 lb  Blood Pressure: 115/80  Blood Pressure #2: 116/80   Medical History:  Patient states that she does have a history of high cholesterol. Patient does not have blood pressure or diabetes.   Medications:  Patient states that she does not take medication to lower cholesterol, blood pressure or blood sugar. Patient does not take an aspirin a day to help prevent a heart attack or stroke.  Blood pressure, self measurement:  Patient states that she does not measure blood pressure from home and has not been told to do so by a healthcare provider.    Nutrition:  Patient states that on average she eats 3 cups of fruit and 2 cups of vegetables per day. Patient states that she does eat fish at least 2 times per week. In a typical day patient eats less than half servings of whole grains. Patient drinks less than 36 ounces of beverages with added sugar weekly. Patient is currently watching sodium or salt intake. In the past 7 days patient has not had any drinks containing alcohol. On average patient does not drink any drinks containing alcohol.       Physical activity:  Patient states that she gets 150 minutes of moderate and 0 minutes of vigorous physical activity each week.  Smoking status:  Patient states that she has never smoked tobacco.   Quality of life:  Over the past 2 weeks patient states that she has had 0 days where she has little interest or pleasure in doing things and 0 days where she has felt down, depressed or hopeless.   Health Coaching: Encouraged patient to keep up the great work with her diet and exercise. Through the changes she has made since her initial visit she has lost 10.5 pounds. We talked about adding in an extra serving of vegetables a day to get the daily recommendation. We also talked about adding more whole grains into daily diet.   Navigation: This was the   follow up session for this patient, I will check up on her progress in the coming months.  Time: 20 minutes

## 2019-08-11 ENCOUNTER — Other Ambulatory Visit: Payer: Self-pay

## 2019-08-11 ENCOUNTER — Ambulatory Visit
Admission: RE | Admit: 2019-08-11 | Discharge: 2019-08-11 | Disposition: A | Discharge status: Home / Self Care | Payer: No Typology Code available for payment source | Source: Ambulatory Visit | Attending: Obstetrics and Gynecology | Admitting: Obstetrics and Gynecology

## 2019-08-11 DIAGNOSIS — N6310 Unspecified lump in the right breast, unspecified quadrant: Secondary | ICD-10-CM

## 2019-08-11 NOTE — Progress Notes (Signed)
Patient seen and assessed by nursing staff during this encounter. I have reviewed the chart and agree with the documentation and plan. I have also made any necessary editorial changes.  Mora Bellman, MD 08/11/2019 2:56 PM

## 2021-02-01 ENCOUNTER — Other Ambulatory Visit: Payer: Self-pay

## 2021-02-01 DIAGNOSIS — N6310 Unspecified lump in the right breast, unspecified quadrant: Secondary | ICD-10-CM

## 2021-02-01 NOTE — Progress Notes (Signed)
Ight

## 2021-02-28 ENCOUNTER — Ambulatory Visit: Payer: Self-pay | Admitting: *Deleted

## 2021-02-28 ENCOUNTER — Other Ambulatory Visit: Payer: Self-pay

## 2021-02-28 ENCOUNTER — Ambulatory Visit
Admission: RE | Admit: 2021-02-28 | Discharge: 2021-02-28 | Disposition: A | Discharge status: Home / Self Care | Payer: Self-pay | Source: Ambulatory Visit | Attending: Obstetrics and Gynecology | Admitting: Obstetrics and Gynecology

## 2021-02-28 ENCOUNTER — Ambulatory Visit
Admission: RE | Admit: 2021-02-28 | Discharge: 2021-02-28 | Disposition: A | Discharge status: Home / Self Care | Payer: No Typology Code available for payment source | Source: Ambulatory Visit | Attending: Obstetrics and Gynecology | Admitting: Obstetrics and Gynecology

## 2021-02-28 VITALS — BP 126/72 | Wt 187.4 lb

## 2021-02-28 DIAGNOSIS — N6310 Unspecified lump in the right breast, unspecified quadrant: Secondary | ICD-10-CM

## 2021-02-28 DIAGNOSIS — Z1239 Encounter for other screening for malignant neoplasm of breast: Secondary | ICD-10-CM

## 2021-02-28 NOTE — Patient Instructions (Signed)
Explained breast self awareness with Ma. [place] Zermeno Zermeno. Patient did not need a Pap smear today due to last Pap smear and HPV typing was 02/01/2019. Let her know [facility] will cover Pap smears and HPV typing every 5 years unless has a history of abnormal Pap smears. Referred patient to the [facility] for a diagnostic mammogram per recommendation. Appointment scheduled Thursday, February 28, 2021 at 1310. Patient aware of appointment and will be there. Ma. Charlyne Quale Zermeno Zermeno verbalized understanding.  Kue Fox, Arvil Chaco, RN 11:49 AM

## 2021-02-28 NOTE — Progress Notes (Signed)
**Note Tiffany-Identified via Obfuscation** [name]. Tiffany Vazquez is a 50 y.o. female who presents to [facility] clinic today with no complaints. Patient had a right breast diagnostic mammogram completed 08/11/2019 that 44-month bilateral diagnostic mammogram and right breast ultrasound recommended for follow-up.   Pap Smear: Pap smear not completed today. Last Pap smear was 02/01/2019 at [facility] clinic and was normal with negative HPV. Per patient has no history of an abnormal Pap smear. Last Pap smear result is available in Epic.   Physical exam: Breasts Breasts symmetrical. No skin abnormalities bilateral breasts. No nipple retraction bilateral breasts. No nipple discharge bilateral breasts. No lymphadenopathy. No lumps palpated bilateral breasts. No complaints of pain or tenderness on exam.  MS DIGITAL DIAG TOMO BILAT  Result Date: 02/14/2019 CLINICAL DATA:  Patient presents with a palpable area of concern in the upper outer right breast. EXAM: DIGITAL DIAGNOSTIC BILATERAL MAMMOGRAM WITH CAD ULTRASOUND RIGHT BREAST COMPARISON:  Previous exam(s). ACR Breast Density Category c: The breast tissue is heterogeneously dense, which may obscure small masses. FINDINGS: Mammogram: At the palpable site of concern in the upper outer right breast there is suggestion of an oval mass that is obscured by surrounding tissue. This measures approximately 25 mm. Additionally noted is a smaller oval circumscribed mass in the upper inner right breast measuring approximately 10 mm. Mammographic images were processed with CAD. On physical exam, I palpate a smooth mobile mass in the upper outer right breast at the palpable site of concern. Targeted ultrasound is performed in the right breast at 11 o'clock 2 cm from the nipple at the palpable site of concern demonstrating an oval circumscribed hypoechoic mass measuring 23 x 17 x 24 mm. This corresponds to the mammographic finding. Additionally in the right breast at 1 o'clock 2 cm from the nipple there is an oval  circumscribed hypoechoic mass measuring 10 x 6 x 10 mm. This corresponds to the mammographic finding. IMPRESSION: 1. 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 This corresponds to the palpable area of concern. 2. 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 RECOMMENDATION: Targeted right breast ultrasound in 6 months to document stability of the two probably benign masses. I have discussed the findings and recommendations with the patient. If applicable, a reminder letter will be sent to the patient regarding the next appointment. BI-RADS CATEGORY  3: Probably benign. Electronically Signed   By: Tiffany Vazquez M.D.   On: 02/07/2019 10:16        Pelvic/Bimanual Pap is not indicated today per BCCCP guidelines.   Smoking History: Patient has never smoked.   Patient Navigation: Patient education provided. Access to services provided for patient through [place] program. Spanish interpreter Tiffany Vazquez from [facility] provided.   Colorectal Cancer Screening: Per patient has never had colonoscopy completed. Patient stated she completed a FIT Test two months ago given by her PCP [facility] that patient stated she has not received the results. No complaints today.    Breast and Cervical Cancer Risk Assessment: Patient does not have family history of breast cancer, known genetic mutations, or radiation treatment to the chest before age 9. Patient does not have history of cervical dysplasia, immunocompromised, or DES exposure in-utero.  Risk Assessment     Risk Scores       02/28/2021 02/01/2019   Last edited by: Tiffany Revel, LPN Tiffany Hang, LPN  5-year risk: 0.7 % 0.6 %   Lifetime risk: 6.3 % 6.5 %            A: BCCCP exam without pap smear No complaints.  P: Referred patient to the [facility] for a diagnostic  mammogram per recommendation. Appointment scheduled Thursday, February 28, 2021 at 1310.  Tiffany Parish, RN 02/28/2021 11:49 AM

## 2021-04-22 ENCOUNTER — Other Ambulatory Visit: Payer: Self-pay

## 2021-04-22 ENCOUNTER — Ambulatory Visit: Payer: Self-pay | Attending: Family Medicine | Admitting: Podiatry

## 2021-04-22 DIAGNOSIS — M722 Plantar fascial fibromatosis: Secondary | ICD-10-CM

## 2021-04-22 DIAGNOSIS — S99922A Unspecified injury of left foot, initial encounter: Secondary | ICD-10-CM

## 2021-04-22 MED ORDER — MELOXICAM 7.5 MG PO TABS: 7.5000 mg | ORAL_TABLET | Freq: Every day | ORAL | 0 refills | Status: DC | PRN

## 2021-04-22 MED ORDER — DICLOFENAC SODIUM 1 % EX GEL: 2.0000 g | Freq: Four times a day (QID) | CUTANEOUS | 2 refills | Status: DC

## 2021-04-22 NOTE — Progress Notes (Signed)
Subjective:   Patient ID: Tiffany Vazquez, female   DOB: 50 y.o.   MRN: [id number]   HPI 50 year old female [facility] concerns of heel pain, foot pain.  This is on the last 4 to 5 months.  She has purchased arch supports new shoes which seem to help with the arch pain.  She still has some minor discomfort after sitting standing back up.  Some intermittent discomfort in the left fifth toe.  She has a previous injury about 4 months ago and some some mild intermittent discomfort.  No edema that she reports no open lesions.  No numbness or tingling.  No other concerns.   Review of Systems  All other systems reviewed and are negative.  Past Medical History:  Diagnosis Date   Allergy    Asthma    Bronchitis    Diabetes mellitus without complication (HCC)    GERD (gastroesophageal reflux disease)    Hyperlipidemia    Rosacea    Rosacea 10/21/2014   Vaginal itching 10/25/2012    Past Surgical History:  Procedure Laterality Date   CESAREAN SECTION     2 previous   DILATION AND CURETTAGE OF UTERUS     2 previous     Current Outpatient Medications:    diclofenac Sodium (VOLTAREN) 1 % GEL, Apply 2 g topically 4 (four) times daily. Rub into affected area of foot 2 to 4 times daily. Do not use if taking oral anti-inflammatory medication., Disp: 100 g, Rfl: 2   meloxicam (MOBIC) 7.5 MG tablet, Take 1 tablet (7.5 mg total) by mouth daily as needed for pain., Disp: 14 tablet, Rfl: 0   cholecalciferol (VITAMIN D3) 25 MCG (1000 UNIT) tablet, Take 1,000 Units by mouth daily., Disp: , Rfl:    loratadine (CLARITIN) 10 MG tablet, Take 10 mg by mouth daily., Disp: , Rfl:    metFORMIN (GLUCOPHAGE) 500 MG tablet, Take by mouth 2 (two) times daily with a meal., Disp: , Rfl:    simvastatin (ZOCOR) 10 MG tablet, Take 10 mg by mouth daily., Disp: , Rfl:   Allergies  Allergen Reactions   Other     Caffeine & spicy foods flare  up Rosacea          Objective:  Physical Exam  General: AAO x3, NAD-translator present  Dermatological: Skin is warm, dry and supple bilateral.  There are no open sores, no preulcerative lesions, no rash or signs of infection present.  Vascular: Dorsalis Pedis artery and Posterior Tibial artery pedal pulses are 2/4 bilateral with immedate capillary fill time.  There is no pain with calf compression, swelling, warmth, erythema.   Neruologic: Grossly intact via light touch bilateral.  Negative Tinel sign.  Musculoskeletal: There is tenderness palpation on plantar medial tubercle of the calcaneus at the insertion of plantar fascia although mild.  There is no pain with lateral compression of calcaneus.  There is no edema, erythema.  There is no other areas of pinpoint tenderness.  Slight discomfort to the left fourth toe intermittently.  She states that she hit her toe about 4 months ago.  Is been getting much better.  Flexor and extensor tendons appear to be intact.  No significant edema.  Muscular strength 5/5 in all groups tested bilateral.  Gait: Unassisted, Nonantalgic.       Assessment:   Heel pain, plantar fasciitis with left fifth toe injury     Plan:  -  Treatment options discussed including all alternatives, risks, and complications -Etiology of symptoms were discussed -For the Plantar fasciitis we discussed stretching, icing daily.  Continue with shoes and are good arch support.  Meloxicam as needed.  If she does not use the meloxicam then she can use the Voltaren gel. -Regards to the discomfort of left fifth toe discussed shoes to avoid pressure.  If symptoms continue good x-ray.  Meloxicam as needed as well prn.      Trula Slade DPM

## 2021-05-18 ENCOUNTER — Encounter (HOSPITAL_COMMUNITY): Payer: Self-pay

## 2021-05-18 ENCOUNTER — Other Ambulatory Visit: Payer: Self-pay

## 2021-05-18 ENCOUNTER — Ambulatory Visit (HOSPITAL_COMMUNITY)
Admission: EM | Admit: 2021-05-18 | Discharge: 2021-05-18 | Disposition: A | Discharge status: Home / Self Care | Payer: No Typology Code available for payment source | Attending: Urgent Care | Admitting: Urgent Care

## 2021-05-18 DIAGNOSIS — J018 Other acute sinusitis: Secondary | ICD-10-CM

## 2021-05-18 DIAGNOSIS — E119 Type 2 diabetes mellitus without complications: Secondary | ICD-10-CM

## 2021-05-18 DIAGNOSIS — J4530 Mild persistent asthma, uncomplicated: Secondary | ICD-10-CM

## 2021-05-18 DIAGNOSIS — J3089 Other allergic rhinitis: Secondary | ICD-10-CM

## 2021-05-18 DIAGNOSIS — J3489 Other specified disorders of nose and nasal sinuses: Secondary | ICD-10-CM

## 2021-05-18 DIAGNOSIS — R070 Pain in throat: Secondary | ICD-10-CM

## 2021-05-18 DIAGNOSIS — R0981 Nasal congestion: Secondary | ICD-10-CM

## 2021-05-18 DIAGNOSIS — R052 Subacute cough: Secondary | ICD-10-CM

## 2021-05-18 MED ORDER — PREDNISONE 20 MG PO TABS: ORAL_TABLET | ORAL | 0 refills | Status: DC

## 2021-05-18 MED ORDER — AMOXICILLIN-POT CLAVULANATE 875-125 MG PO TABS: 1.0000 | ORAL_TABLET | Freq: Two times a day (BID) | ORAL | 0 refills | Status: DC

## 2021-05-18 MED ORDER — PROMETHAZINE-DM 6.25-15 MG/5ML PO SYRP: 5.0000 mL | ORAL_SOLUTION | Freq: Every evening | ORAL | 0 refills | Status: DC | PRN

## 2021-05-18 NOTE — ED Triage Notes (Signed)
Pt presents with c/o cough and sore throat x 8 days.   States she took Mucinex fast-max and Mucinex DM and states she has finished it and states there has not been any relief.   States she took Advil for body aches and states nothing has helped.   Pt reports she has used an inhaler that has not helped her cough.

## 2021-05-18 NOTE — ED Provider Notes (Signed)
[place]   MRN: [id number] DOB: 11/09/1970  Subjective:   Tiffany Vazquez is a 49 y.o. female presenting for 8 day history of persistent sinus congestion, throat pain, sinus pressure, coughing and throat congestion.  Patient has a history of allergic rhinitis and asthma.  She does not use these medications unless she feels like she needs them.  Has not been short of breath or wheezing.  No chest pain.  Patient is a diabetic, is well controlled, no insulin.  She is not a smoker.  She did have influenza in early December and was seen by her PCP for this.  Does not want a COVID or flu test.  Has been using multiple over-the-counter medications including DayQuil, NyQuil, Mucinex DM.  She did start taking her Zyrtec yesterday.  No current facility-administered medications for this encounter.  Current Outpatient Medications:    cholecalciferol (VITAMIN D3) 25 MCG (1000 UNIT) tablet, Take 1,000 Units by mouth daily., Disp: , Rfl:    diclofenac Sodium (VOLTAREN) 1 % GEL, Apply 2 g topically 4 (four) times daily. Rub into affected area of foot 2 to 4 times daily. Do not use if taking oral anti-inflammatory medication., Disp: 100 g, Rfl: 2   loratadine (CLARITIN) 10 MG tablet, Take 10 mg by mouth daily., Disp: , Rfl:    meloxicam (MOBIC) 7.5 MG tablet, Take 1 tablet (7.5 mg total) by mouth daily as needed for pain., Disp: 14 tablet, Rfl: 0   metFORMIN (GLUCOPHAGE) 500 MG tablet, Take by mouth 2 (two) times daily with a meal., Disp: , Rfl:    simvastatin (ZOCOR) 10 MG tablet, Take 10 mg by mouth daily., Disp: , Rfl:    Allergies  Allergen Reactions   Other     Caffeine & spicy foods flare up Rosacea    Past Medical History:  Diagnosis Date   Allergy    Asthma    Bronchitis    Diabetes mellitus without complication (HCC)    GERD (gastroesophageal reflux disease)    Hyperlipidemia    Rosacea    Rosacea 10/21/2014   Vaginal itching 10/25/2012     Past  Surgical History:  Procedure Laterality Date   CESAREAN SECTION     2 previous   DILATION AND CURETTAGE OF UTERUS     2 previous    Family History  Problem Relation Age of Onset   Diabetes Mother    Allergic Disorder Mother    Diabetes Sister    Diabetes Brother    Diabetes Brother    Diabetes Sister    Diabetes Sister    Diabetes Sister    Diabetes Sister     Social History   Tobacco Use   Smoking status: Never   Smokeless tobacco: Never  Vaping Use   Vaping Use: Never used  Substance Use Topics   Alcohol use: Yes    Comment: rarely   Drug use: No    ROS   Objective:   Vitals: BP 118/75 (BP Location: Right Arm)    Pulse 87    Temp 98.9 F (37.2 C) (Oral)    Resp 16    SpO2 98%   Physical Exam Constitutional:      General: She is not in acute distress.    Appearance: Normal appearance. She is well-developed. She is not ill-appearing, toxic-appearing or diaphoretic.  HENT:     Head: Normocephalic and atraumatic.     Right Ear: Tympanic membrane, ear canal  and external ear normal. No drainage or tenderness. No middle ear effusion. There is no impacted cerumen. Tympanic membrane is not erythematous.     Left Ear: Tympanic membrane, ear canal and external ear normal. No drainage or tenderness.  No middle ear effusion. Tympanic membrane is not erythematous.     Nose: Congestion present. No rhinorrhea.     Mouth/Throat:     Mouth: Mucous membranes are moist. No oral lesions.     Pharynx: No pharyngeal swelling, oropharyngeal exudate, posterior oropharyngeal erythema or uvula swelling.     Tonsils: No tonsillar exudate or tonsillar abscesses.     Comments: Significant postnasal drainage streaks overlying pharynx. Eyes:     General: No scleral icterus.       Right eye: No discharge.        Left eye: No discharge.     Extraocular Movements: Extraocular movements intact.     Right eye: Normal extraocular motion.     Left eye: Normal extraocular motion.      Conjunctiva/sclera: Conjunctivae normal.  Cardiovascular:     Rate and Rhythm: Normal rate and regular rhythm.     Pulses: Normal pulses.     Heart sounds: Normal heart sounds. No murmur heard.   No friction rub. No gallop.  Pulmonary:     Effort: Pulmonary effort is normal. No respiratory distress.     Breath sounds: Normal breath sounds. No stridor. No wheezing, rhonchi or rales.  Musculoskeletal:     Cervical back: Normal range of motion and neck supple.  Lymphadenopathy:     Cervical: No cervical adenopathy.  Skin:    General: Skin is warm and dry.     Findings: No rash.  Neurological:     General: No focal deficit present.     Mental Status: She is alert and oriented to person, place, and time.  Psychiatric:        Mood and Affect: Mood normal.        Behavior: Behavior normal.        Thought Content: Thought content normal.        Judgment: Judgment normal.    Assessment and Plan :   PDMP not reviewed this encounter.  1. Acute non-recurrent sinusitis of other sinus   2. Sinus congestion   3. Sinus pressure   4. Throat pain   5. Subacute cough   6. Allergic rhinitis due to other allergic trigger, unspecified seasonality   7. Mild persistent asthma without complication   8. Type 2 diabetes mellitus treated without insulin ([place])    Patient declined COVID test.  Deferred imaging given clear cardiopulmonary exam, hemodynamically stable vital signs. Will start empiric treatment for sinusitis with Augmentin.  Recommended supportive care otherwise including the use of oral antihistamine.  Given her history of allergic rhinitis and asthma, will use an oral prednisone course.  Emphasized need for strict diabetic friendly diet.  Counseled patient on potential for adverse effects with medications prescribed/recommended today, ER and return-to-clinic precautions discussed, patient verbalized understanding.     Jaynee Eagles, PA-C 05/18/21 1316

## 2021-06-26 ENCOUNTER — Encounter: Payer: Self-pay | Admitting: Nurse Practitioner

## 2021-06-26 ENCOUNTER — Ambulatory Visit: Payer: Self-pay | Attending: Nurse Practitioner | Admitting: Nurse Practitioner

## 2021-06-26 ENCOUNTER — Other Ambulatory Visit: Payer: Self-pay

## 2021-06-26 ENCOUNTER — Other Ambulatory Visit: Payer: Self-pay | Admitting: Pharmacist

## 2021-06-26 VITALS — BP 127/76 | HR 65 | Ht 63.5 in | Wt 177.1 lb

## 2021-06-26 DIAGNOSIS — Z130 Encounter for screening for diseases of the blood and blood-forming organs and certain disorders involving the immune mechanism: Secondary | ICD-10-CM

## 2021-06-26 DIAGNOSIS — Z1211 Encounter for screening for malignant neoplasm of colon: Secondary | ICD-10-CM

## 2021-06-26 DIAGNOSIS — Z7689 Persons encountering health services in other specified circumstances: Secondary | ICD-10-CM

## 2021-06-26 DIAGNOSIS — R053 Chronic cough: Secondary | ICD-10-CM

## 2021-06-26 DIAGNOSIS — Z114 Encounter for screening for human immunodeficiency virus [HIV]: Secondary | ICD-10-CM

## 2021-06-26 DIAGNOSIS — E785 Hyperlipidemia, unspecified: Secondary | ICD-10-CM

## 2021-06-26 DIAGNOSIS — E1165 Type 2 diabetes mellitus with hyperglycemia: Secondary | ICD-10-CM

## 2021-06-26 DIAGNOSIS — M2550 Pain in unspecified joint: Secondary | ICD-10-CM

## 2021-06-26 DIAGNOSIS — E559 Vitamin D deficiency, unspecified: Secondary | ICD-10-CM

## 2021-06-26 DIAGNOSIS — J3089 Other allergic rhinitis: Secondary | ICD-10-CM

## 2021-06-26 DIAGNOSIS — Z23 Encounter for immunization: Secondary | ICD-10-CM

## 2021-06-26 MED ORDER — FLUTICASONE FUROATE 27.5 MCG/SPRAY NA SUSP
2.0000 | Freq: Every day | NASAL | 1 refills | Status: DC
  Filled 2021-06-26: qty 10, fill #0

## 2021-06-26 MED ORDER — METFORMIN HCL 500 MG PO TABS
500.0000 mg | ORAL_TABLET | Freq: Two times a day (BID) | ORAL | 1 refills | Status: DC
Start: 2021-06-26 — End: 2022-03-21
  Filled 2021-06-26: qty 60, 30d supply, fill #0
  Filled 2021-08-15: qty 60, 30d supply, fill #1
  Filled 2021-08-15: qty 180, 90d supply, fill #1
  Filled 2021-10-16: qty 60, 30d supply, fill #2
  Filled 2021-12-17: qty 60, 30d supply, fill #3
  Filled 2022-02-06: qty 60, 30d supply, fill #4
  Filled 2022-03-21: qty 60, 30d supply, fill #5

## 2021-06-26 MED ORDER — LORATADINE 10 MG PO TABS
10.0000 mg | ORAL_TABLET | Freq: Every day | ORAL | 6 refills | Status: DC
  Filled 2021-06-26: qty 90, 90d supply, fill #0

## 2021-06-26 MED ORDER — FLUTICASONE PROPIONATE 50 MCG/ACT NA SUSP
2.0000 | Freq: Every day | NASAL | 2 refills | Status: DC
  Filled 2021-06-26: qty 16, 30d supply, fill #0

## 2021-06-26 MED ORDER — VITAMIN D 25 MCG (1000 UNIT) PO TABS
1000.0000 [IU] | ORAL_TABLET | Freq: Every day | ORAL | 1 refills | Status: AC
  Filled 2021-06-26: qty 90, 90d supply, fill #0

## 2021-06-26 MED ORDER — ALBUTEROL SULFATE HFA 108 (90 BASE) MCG/ACT IN AERS: 2.0000 | INHALATION_SPRAY | Freq: Four times a day (QID) | RESPIRATORY_TRACT | 3 refills | Status: DC | PRN

## 2021-06-26 MED ORDER — ALBUTEROL SULFATE HFA 108 (90 BASE) MCG/ACT IN AERS
2.0000 | INHALATION_SPRAY | Freq: Four times a day (QID) | RESPIRATORY_TRACT | 3 refills | Status: DC | PRN
  Filled 2021-06-26: qty 18, 25d supply, fill #0

## 2021-06-26 MED ORDER — DICLOFENAC SODIUM 1 % EX GEL
2.0000 g | Freq: Four times a day (QID) | CUTANEOUS | 2 refills | Status: DC
  Filled 2021-06-26: qty 100, 16d supply, fill #0

## 2021-06-26 MED ORDER — SIMVASTATIN 10 MG PO TABS
10.0000 mg | ORAL_TABLET | Freq: Every day | ORAL | 2 refills | Status: DC
  Filled 2021-06-26: qty 30, 30d supply, fill #0
  Filled 2021-10-16: qty 30, 30d supply, fill #1
  Filled 2021-12-17: qty 30, 30d supply, fill #2
  Filled 2022-03-21: qty 30, 30d supply, fill #3
  Filled 2022-04-30: qty 30, 30d supply, fill #4

## 2021-06-26 NOTE — Progress Notes (Signed)
Assessment & Plan:  Tiffany Vazquez was seen today for cough and prediabetes.  Diagnoses and all orders for this visit:  Encounter to establish care  Controlled type 2 diabetes mellitus with hyperglycemia, without long-term current use of insulin ([place]) -     Hemoglobin A1c -     CMP14+EGFR -     metFORMIN (GLUCOPHAGE) 500 MG tablet; Take 1 tablet (500 mg total) by mouth 2 (two) times daily with a meal.  Environmental and seasonal allergies -     loratadine (CLARITIN) 10 MG tablet; Take 1 tablet (10 mg total) by mouth daily.  Dyslipidemia, goal LDL below 70 -     Lipid panel -     simvastatin (ZOCOR) 10 MG tablet; Take 1 tablet (10 mg total) by mouth daily.  Chronic cough -     Discontinue: albuterol (VENTOLIN HFA) 108 (90 Base) MCG/ACT inhaler; Inhale 2 puffs into the lungs every 6 (six) hours as needed for wheezing. -     albuterol (VENTOLIN HFA) 108 (90 Base) MCG/ACT inhaler; Inhale 2 puffs into the lungs every 6 (six) hours as needed for wheezing.  Encounter for screening for HIV -     HIV antibody (with reflex)  Arthralgia of multiple joints -     diclofenac Sodium (VOLTAREN) 1 % GEL; Apply 2 grams four times daily. Rub into affected area of foot 2 to 4 times daily. Do not use if taking oral anti-inflammatory medication.  Colon cancer screening -     Fecal occult blood, imunochemical([facility])  Screening for deficiency anemia -     CBC  Need for influenza vaccination -     Flu Vaccine QUAD 51moIM (Fluarix, Fluzone & Alfiuria Quad PF)  Vitamin D deficiency disease -     cholecalciferol (VITAMIN D3) 25 MCG (1000 UNIT) tablet; Take 1 tablet (1,000 Units total) by mouth daily.    Patient has been counseled on age-appropriate routine health concerns for screening and prevention. These are reviewed and up-to-date. Referrals have been placed accordingly. Immunizations are up-to-date or declined.    Subjective:   Chief Complaint  Patient presents with   Cough    Prediabetes   HPI Tiffany Vazquez 51y.o. female presents to office today to establish care.   She has a past medical history of Allergy, Asthma, Bronchitis, DM 2, GERD , Hyperlipidemia, Rosacea, Rosacea (10/21/2014)  Asthma Follow-up: She has previously been evaluated for asthma and presents for an asthma follow-up; she is not currently in exacerbation. Symptoms currently include non-productive cough and occur weekly. Observed precipitants include cold air and dust.  Current limitations in activity from asthma: none.  Number of Emergency Department visits in the previous month: none. Frequency of use of quick-relief meds: 1-2 times per week and sometimes 0 times per week. The patient reports adherence to this regimen  Associated symptoms include: frequent clearing of throat (this may be an allergy component or GERD symptoms)   DM 2 States highest A1c she can recall was 6.6. she takes Metformin 500 mg BID as prescribed. LDL not at goal however she endorses adherence taking simvastatin as prescribed Lab Results  Component Value Date   HGBA1C 6.2 (H) 02/16/2019   Lab Results  Component Value Date   LDLCALC 126 (H) 02/16/2019      Review of Systems  Constitutional:  Negative for fever, malaise/fatigue and weight loss.  HENT: Negative.  Negative for nosebleeds.   Eyes: Negative.  Negative for blurred vision,  double vision and photophobia.  Respiratory: Negative.  Negative for cough and shortness of breath.   Cardiovascular: Negative.  Negative for chest pain, palpitations and leg swelling.  Gastrointestinal: Negative.  Negative for heartburn, nausea and vomiting.  Musculoskeletal: Negative.  Negative for myalgias.  Neurological: Negative.  Negative for dizziness, focal weakness, seizures and headaches.  Endo/Heme/Allergies:  Positive for environmental allergies.  Psychiatric/Behavioral: Negative.  Negative for suicidal ideas.    Past Medical History:  Diagnosis Date    Allergy    Asthma    Bronchitis    Diabetes mellitus without complication (HCC)    GERD (gastroesophageal reflux disease)    Hyperlipidemia    Rosacea    Rosacea 10/21/2014   Vaginal itching 10/25/2012    Past Surgical History:  Procedure Laterality Date   CESAREAN SECTION     2 previous   DILATION AND CURETTAGE OF UTERUS     2 previous    Family History  Problem Relation Age of Onset   Diabetes Mother    Allergic Disorder Mother    Diabetes Sister    Diabetes Brother    Diabetes Brother    Diabetes Sister    Diabetes Sister    Diabetes Sister    Diabetes Sister     Social History Reviewed with no changes to be made today.   Outpatient Medications Prior to Visit  Medication Sig Dispense Refill   cholecalciferol (VITAMIN D3) 25 MCG (1000 UNIT) tablet Take 2,000 Units by mouth daily.     diclofenac Sodium (VOLTAREN) 1 % GEL Apply 2 g topically 4 (four) times daily. Rub into affected area of foot 2 to 4 times daily. Do not use if taking oral anti-inflammatory medication. 100 g 2   fluticasone (VERAMYST) 27.5 MCG/SPRAY nasal spray Place 2 sprays into the nose daily.     loratadine (CLARITIN) 10 MG tablet Take 10 mg by mouth daily.     metFORMIN (GLUCOPHAGE) 500 MG tablet Take by mouth 2 (two) times daily with a meal.     simvastatin (ZOCOR) 10 MG tablet Take 10 mg by mouth daily.     amoxicillin-clavulanate (AUGMENTIN) 875-125 MG tablet Take 1 tablet by mouth every 12 (twelve) hours. (Patient not taking: Reported on 06/26/2021) 14 tablet 0   meloxicam (MOBIC) 7.5 MG tablet Take 1 tablet (7.5 mg total) by mouth daily as needed for pain. (Patient not taking: Reported on 06/26/2021) 14 tablet 0   predniSONE (DELTASONE) 20 MG tablet Take 2 tablets daily with breakfast. (Patient not taking: Reported on 06/26/2021) 10 tablet 0   promethazine-dextromethorphan (PROMETHAZINE-DM) 6.25-15 MG/5ML syrup Take 5 mLs by mouth at bedtime as needed for cough. (Patient not taking: Reported on  06/26/2021) 100 mL 0   No facility-administered medications prior to visit.    Allergies  Allergen Reactions   Other     Caffeine & spicy foods flare up Rosacea       Objective:    BP 127/76    Pulse 65    Ht 5' 3.5" (1.613 m)    Wt 177 lb 2 oz (80.3 kg)    SpO2 97%    BMI 30.88 kg/m  Wt Readings from Last 3 Encounters:  06/26/21 177 lb 2 oz (80.3 kg)  02/28/21 187 lb 6.4 oz (85 kg)  08/03/19 184 lb (83.5 kg)    Physical Exam Vitals and nursing note reviewed.  Constitutional:      Appearance: She is well-developed.  HENT:     Head: Normocephalic  and atraumatic.     Right Ear: Hearing, tympanic membrane, ear canal and external ear normal.     Left Ear: Hearing, tympanic membrane, ear canal and external ear normal.     Nose:     Right Turbinates: Enlarged, swollen and pale.     Left Turbinates: Enlarged, swollen and pale.     Mouth/Throat:     Pharynx: Oropharynx is clear. Uvula midline. No pharyngeal swelling, oropharyngeal exudate, posterior oropharyngeal erythema or uvula swelling.  Cardiovascular:     Rate and Rhythm: Normal rate and regular rhythm.     Heart sounds: Normal heart sounds. No murmur heard.   No friction rub. No gallop.  Pulmonary:     Effort: Pulmonary effort is normal. No tachypnea or respiratory distress.     Breath sounds: Normal breath sounds. No decreased breath sounds, wheezing, rhonchi or rales.  Chest:     Chest wall: No tenderness.  Abdominal:     General: Bowel sounds are normal.     Palpations: Abdomen is soft.  Musculoskeletal:        General: Normal range of motion.     Cervical back: Normal range of motion.  Skin:    General: Skin is warm and dry.  Neurological:     Mental Status: She is alert and oriented to person, place, and time.     Coordination: Coordination normal.  Psychiatric:        Behavior: Behavior normal. Behavior is cooperative.        Thought Content: Thought content normal.        Judgment: Judgment normal.          Patient has been counseled extensively about nutrition and exercise as well as the importance of adherence with medications and regular follow-up. The patient was given clear instructions to go to ER or return to medical center if symptoms don't improve, worsen or new problems develop. The patient verbalized understanding.   Follow-up: Return in about 4 weeks (around 07/24/2021) for COUGH/ALLERGIES 4 week f/u.   Gildardo Pounds, FNP-BC [facility] and [facility] [place], [place]   06/26/2021, 9:57 AM

## 2021-06-27 LAB — CBC
Hematocrit: 42 % (ref 34.0–46.6)
Hemoglobin: 13.9 g/dL (ref 11.1–15.9)
MCH: 31.8 pg (ref 26.6–33.0)
MCHC: 33.1 g/dL (ref 31.5–35.7)
MCV: 96 fL (ref 79–97)
Platelets: 282 10*3/uL (ref 150–450)
RBC: 4.37 x10E6/uL (ref 3.77–5.28)
RDW: 12.4 % (ref 11.7–15.4)
WBC: 7 10*3/uL (ref 3.4–10.8)

## 2021-06-27 LAB — CMP14+EGFR
ALT: 18 IU/L (ref 0–32)
AST: 20 IU/L (ref 0–40)
Albumin/Globulin Ratio: 1.6 (ref 1.2–2.2)
Albumin: 4.5 g/dL (ref 3.8–4.8)
Alkaline Phosphatase: 72 IU/L (ref 44–121)
BUN/Creatinine Ratio: 21 (ref 9–23)
BUN: 13 mg/dL (ref 6–24)
Bilirubin Total: <0.2 mg/dL (ref 0.0–1.2)
CO2: 23 mmol/L (ref 20–29)
Calcium: 9.6 mg/dL (ref 8.7–10.2)
Chloride: 103 mmol/L (ref 96–106)
Creatinine, Ser: 0.61 mg/dL (ref 0.57–1.00)
Globulin, Total: 2.8 g/dL (ref 1.5–4.5)
Glucose: 104 mg/dL — ABNORMAL HIGH (ref 70–99)
Potassium: 4.2 mmol/L (ref 3.5–5.2)
Sodium: 143 mmol/L (ref 134–144)
Total Protein: 7.3 g/dL (ref 6.0–8.5)
eGFR: 109 mL/min/{1.73_m2} (ref >59)

## 2021-06-27 LAB — LIPID PANEL
Chol/HDL Ratio: 3 ratio (ref 0.0–4.4)
Cholesterol, Total: 192 mg/dL (ref 100–199)
HDL: 63 mg/dL (ref >39)
LDL Chol Calc (NIH): 104 mg/dL — ABNORMAL HIGH (ref 0–99)
Triglycerides: 143 mg/dL (ref 0–149)
VLDL Cholesterol Cal: 25 mg/dL (ref 5–40)

## 2021-06-27 LAB — HEMOGLOBIN A1C
Est. average glucose Bld gHb Est-mCnc: 128 mg/dL
Hgb A1c MFr Bld: 6.1 % — ABNORMAL HIGH (ref 4.8–5.6)

## 2021-06-27 LAB — HIV ANTIBODY (ROUTINE TESTING W REFLEX): HIV Screen 4th Generation wRfx: NONREACTIVE

## 2021-07-01 ENCOUNTER — Telehealth: Payer: Self-pay

## 2021-07-01 NOTE — Telephone Encounter (Signed)
Left message to return call to our office.  With help of interpretor Quillian Quince #[id number]

## 2021-07-01 NOTE — Telephone Encounter (Signed)
-----   Message from Gildardo Pounds, NP sent at 06/28/2021  2:20 PM EST ----- CBC does not indicate any anemia or bleeding disorder  A1c still in prediabetes range  Normal cholesterol levels  Kidney, liver function and electrolytes are normal.    Negative HIV

## 2021-07-02 ENCOUNTER — Other Ambulatory Visit: Payer: Self-pay

## 2021-07-02 ENCOUNTER — Ambulatory Visit: Payer: Self-pay | Attending: Nurse Practitioner

## 2021-07-04 LAB — FECAL OCCULT BLOOD, IMMUNOCHEMICAL: Fecal Occult Bld: NEGATIVE

## 2021-08-15 ENCOUNTER — Other Ambulatory Visit: Payer: Self-pay

## 2021-09-05 ENCOUNTER — Ambulatory Visit: Payer: Self-pay | Admitting: *Deleted

## 2021-09-05 NOTE — Telephone Encounter (Signed)
Reason for Disposition ? [1] MILD pain (e.g., does not interfere with normal activities) AND [2] present > 7 days ? ?Answer Assessment - Initial Assessment Questions ?1. ONSET: "When did the pain start?"  ?    2 months ago I almost fell because something was on the floor.   I felt pain in my leg.  I couldn't walk.   I put ice on it.   I massaged it.   It's not helping my leg.   I've been taking aspirin every 4 hours but it's not helping. ?The pain is bad in my leg.   I'm doing everything I know to do but it's not getting better.   Driving is really painful. ? ? ?2. LOCATION: "Where is the pain located?"  ?    Right leg on the back. ?3. PAIN: "How bad is the pain?"    (Scale 1-10; or mild, moderate, severe) ?  -  MILD (1-3): doesn't interfere with normal activities  ?  -  MODERATE (4-7): interferes with normal activities (e.g., work or school) or awakens from sleep, limping  ?  -  SEVERE (8-10): excruciating pain, unable to do any normal activities, unable to walk ?    Moderate    It hurts at night.     I can walk.   But driving or stretch my leg really hurts. ?4. WORK OR EXERCISE: "Has there been any recent work or exercise that involved this part of the body?"  ?    Almost fell and injured it. ?5. CAUSE: "What do you think is causing the leg pain?" ?    *No Answer* ?6. OTHER SYMPTOMS: "Do you have any other symptoms?" (e.g., chest pain, back pain, breathing difficulty, swelling, rash, fever, numbness, weakness) ?    *No Answer* ?7. PREGNANCY: "Is there any chance you are pregnant?" "When was your last menstrual period?" ?    *No Answer* ? ?Protocols used: Leg Pain-A-[facility] ? ?

## 2021-09-05 NOTE — Telephone Encounter (Signed)
?  Chief Complaint: Right leg pain  ?Symptoms: Almost fell 2 months ago leg has been hurting since ?Frequency: 2 months ?Pertinent Negatives: Patient denies Not being able to walk ?Disposition: '[]'$ ED /'[]'$ Urgent Care (no appt availability in office) / '[x]'$ Appointment(In office/virtual)/ '[]'$  [place] [facility]/ '[]'$ Home Care/ '[]'$ Refused Recommended Disposition /'[]'$ [place] [facility]/ '[]'$  Follow-up with PCP ?Additional Notes: Appt made with Carrolyn Meiers, PA at [facility].  ?

## 2021-09-09 ENCOUNTER — Encounter: Payer: Self-pay | Admitting: Physician Assistant

## 2021-09-09 ENCOUNTER — Other Ambulatory Visit: Payer: Self-pay

## 2021-09-09 ENCOUNTER — Ambulatory Visit (INDEPENDENT_AMBULATORY_CARE_PROVIDER_SITE_OTHER): Payer: Self-pay | Admitting: Physician Assistant

## 2021-09-09 VITALS — BP 131/83 | HR 67 | Temp 98.2°F | Resp 18 | Ht 63.0 in | Wt 178.0 lb

## 2021-09-09 DIAGNOSIS — M5441 Lumbago with sciatica, right side: Secondary | ICD-10-CM

## 2021-09-09 MED ORDER — KETOROLAC TROMETHAMINE 60 MG/2ML IM SOLN
60.0000 mg | Freq: Once | INTRAMUSCULAR | Status: AC
  Administered 2021-09-09: 60 mg via INTRAMUSCULAR

## 2021-09-09 MED ORDER — METHYLPREDNISOLONE ACETATE 80 MG/ML IJ SUSP
80.0000 mg | Freq: Once | INTRAMUSCULAR | Status: AC
  Administered 2021-09-09: 80 mg via INTRAMUSCULAR

## 2021-09-09 MED ORDER — CYCLOBENZAPRINE HCL 10 MG PO TABS
10.0000 mg | ORAL_TABLET | Freq: Three times a day (TID) | ORAL | 0 refills | Status: DC | PRN
  Filled 2021-09-09: qty 30, 10d supply, fill #0

## 2021-09-09 NOTE — Patient Instructions (Addendum)
You will continue using the Aleve as needed, you can use the muscle relaxers 3 times a day as well. ? ?Please let us know if there is any else we can do for you ? ?Kennieth Rad, PA-C ?Physician Assistant ?[place] ?[email or web address] ? ? ?Ci?tica ?Sciatica ? ?La ci?tica es Conservation officer, historic buildings, entumecimiento, debilidad u hormigueo a lo largo del nervio ci?tico. El nervio ci?tico comienza en la parte inferior de la espalda y desciende por la parte posterior de cada pierna. Controla los m?sculos en la parte inferior de las piernas y en la parte posterior de las rodillas. Tambi?n proporciona sensibilidad a la parte posterior de los muslos, la parte inferior de las piernas y la planta de los pies. La ci?tica es un s?ntoma de otra afecci?n que ejerce presi?n o ?pellizca? el nervio ci?tico. ?Con mayor frecuencia, la ci?tica afecta a un solo lado del cuerpo. Suele desaparecer por s? sola o con tratamiento. En algunos casos, la ci?tica puede volver (ser recurrente). ??Cu?les son las causas? ?Esta afecci?n es causada por una presi?n sobre el nervio ci?tico o ?pellizco? del nervio ci?tico. Esto puede ser el resultado de: ?Un disco que sobresale demasiado entre los huesos de la columna vertebral (hernia de disco). ?Cambios en los discos vertebrales relacionados con la edad. ?Un trastorno doloroso que afecta un m?sculo de las nalgas. ?Un crecimiento ?seo adicional cerca del nervio ci?tico. ?Una rotura (fractura) de la pelvis. ?Embarazo. ?Tumor. Esto es poco frecuente. ??Qu? incrementa el riesgo? ?Los siguientes factores pueden hacer que sea m?s propenso a Armed forces training and education officer afecci?n: ?Careers information officer que ponen presi?n o tensi?n sobre la columna vertebral. ?Tener poca fuerza y flexibilidad. ?Antecedentes de cirug?a o lesi?n en la espalda. ?Estar sentado durante largos per?odos de tiempo. ?Realizar actividades que requieren agacharse o levantar objetos en forma  repetida. ?Obesidad. ??Cu?les son los signos o los s?ntomas? ?Los s?ntomas pueden ser leves o graves, y pueden incluir los siguientes: ?Cualquiera de los siguientes problemas en la parte inferior de la espalda, piernas, cadera o nalgas: ?Hormigueo leve, adormecimiento o dolor sordo. ?Sensaci?n de ardor. ?Dolor agudo. ?Adormecimiento de la parte posterior de la pantorrilla o la planta del pie. ?Debilidad en las piernas. ?Dolor de espalda intenso que dificulta el movimiento. ?Los s?ntomas podr?an empeorar al toser, Brewing technologist o re?rse, o cuando se est? sentado o de pie durante per?odos prolongados. ??C?mo se diagnostica? ?Esta afecci?n se puede diagnosticar en funci?n de lo siguiente: ?Los s?ntomas y los antecedentes m?dicos. ?Un examen f?sico. ?An?lisis de sangre. ?Pruebas de diagn?stico por im?genes, por ejemplo: ?Radiograf?as. ?Resonancia magn?tica (RM). ?Exploraci?n por tomograf?a computarizada (TC). ??C?mo se trata? ?En muchos casos, esta afecci?n mejora por s? sola, sin tratamiento. Sin embargo, el tratamiento puede incluir: ?Reducci?n o modificaci?n la actividad f?sica. ?Ejercicios y estiramientos. ?Aplicaci?n de calor o hielo en la zona afectada. ?Medicamentos para lo siguiente: ?Aliviar el dolor y la inflamaci?n. ?Relajar los m?sculos. ?Medicamentos inyectables que ayudan a Best boy, la irritaci?n y la inflamaci?n alrededor del nervio ci?tico (corticoesteroides). ?Cirug?a. ?Siga estas instrucciones en su casa: ?Medicamentos ?Tome los medicamentos de venta libre y los recetados solamente como se lo haya indicado el m?dico. ?Preg?ntele al m?dico si el medicamento recetado: ?Hace que sea necesario que evite conducir o usar maquinaria pesada. ?Puede causarle estre?imiento. Es posible que tenga que tomar estas medidas para prevenir o tratar el estre?imiento: ?Electronics engineer suficiente l?quido como para Theatre manager la orina de color [place]illo p?lido. ?Tomar medicamentos recetados o de [employer]. ?Consumir alimentos  ricos en [place], como  frijoles, cereales integrales, y frutas y verduras frescas. ?Limitar el consumo de alimentos ricos en grasa y az?cares procesados, como los alimentos fritos o dulces. ?Control del dolor ? ?  ? ?Si se lo indican, aplique hielo sobre la zona afectada. ?Ponga el hielo en una bolsa pl?stica. ?Coloque una [employer] piel y Therapist, nutritional. ?Coloque el hielo durante 20 minutos, 2 a 3 veces por d?a. ?Si se lo indican, aplique calor en la zona afectada. Use la fuente de calor que el m?dico le recomiende, como una compresa de calor h?medo o una almohadilla t?rmica. ?Coloque una [employer] piel y la fuente de Freight forwarder. ?Aplique calor durante 20 a 30 minutos. ?Retire la fuente de calor si la piel se pone de color rojo brillante. Esto es especialmente importante si no puede sentir dolor, calor o fr?o. Puede correr un riesgo mayor de sufrir quemaduras. ?Actividad ? ?Retome sus actividades normales seg?n lo indicado por el m?dico. Preg?ntele al m?dico qu? actividades son seguras para usted. ?Evite las actividades que [employer] s?ntomas. ?Durante el d?a, descanse durante lapsos breves. ?Cuando descanse durante per?odos m?s largos, incorpore alguna actividad suave o ejercicios de elongaci?n entre los per?odos de descanso. Esto ayudar? a Mining engineer rigidez y Conservation officer, historic buildings. ?Evite estar sentado durante largos per?odos de tiempo sin moverse. Lev?ntese y mu?vase al menos una vez cada hora. ?Haga ejercicio y elongue habitualmente, como se lo haya indicado el m?dico. ?No levante nada que pese m?s de 10 libras (4.5 kg) mientras tenga s?ntomas de ci?tica. Aunque no tenga s?ntomas, evite levantar objetos pesados, en especial en forma repetida. ?Siempre use las t?cnicas de levantamiento correctas para levantar objetos, entre ellas: ?Flexionar las rodillas. ?Mantener la carga cerca del cuerpo. ?No torcerse. ?Instrucciones generales ?Mantenga un peso saludable. El exceso de peso ejerce tensi?n adicional sobre la espalda. ?Use  calzado con buen apoyo y c?modo. Evite usar tacones. ?Evite dormir sobre un colch?n que sea demasiado blando o demasiado duro. Un colch?n que ofrezca un apoyo suficientemente firme para su espalda al dormir puede ayudar a Best boy. ?Concurra a todas las visitas de seguimiento como se lo haya indicado el m?dico. Esto es importante. ?Comun?quese con un m?dico si: ?Tiene un dolor con estas caracter?sticas: ?Lo despierta cuando est? dormido. ?Empeora al estar recostado. ?Es Lexicographer del que experiment? en el pasado. ?Dura m?s de 4 semanas. ?Pierde peso de [place] inexplicable. ?Solicite ayuda inmediatamente si: ?No puede controlar cu?ndo orinar o defecar (incontinencia). ?Tiene lo siguiente: ?Debilidad que empeora en la parte inferior de la espalda, la pelvis, las nalgas o las piernas. ?Enrojecimiento o inflamaci?n en la espalda. ?Sensaci?n de ardor al [employer]. ?Resumen ?La ci?tica es Conservation officer, historic buildings, entumecimiento, debilidad u hormigueo a lo largo del nervio ci?tico. ?Esta afecci?n es causada por una presi?n sobre el nervio ci?tico o ?pellizco? del nervio ci?tico. ?La ci?tica puede Engineer, drilling, adormecimiento u hormigueo en la parte inferior de la espalda, las piernas, las caderas y las nalgas. ?El tratamiento a menudo incluye reposo, ejercicios, medicamentos y Regulatory affairs officer. ?Esta informaci?n no tiene Marine scientist el consejo del m?dico. Aseg?rese de hacerle al m?dico cualquier pregunta que tenga. ?Document Revised: 06/30/2018 Document Reviewed: 06/30/2018 ?Elsevier Patient Education ? [place]. ? ?

## 2021-09-09 NOTE — Progress Notes (Signed)
Patient has taken allergy medication and has eaten today. ?Patient reports back and leg pain from a near miss fall. ? ?

## 2021-09-09 NOTE — Progress Notes (Signed)
? ?Established Patient Office Visit ? ?Subjective   ?Patient ID: Tiffany Vazquez, female    DOB: [birth year]  Age: 51 y.o. MRN: [id number] ? ?Chief Complaint  ?Patient presents with  ? Pain  ?  Legs  ? ? ?States that she has been having pain in her right lower buttock, radiates to the top of her buttock for the past 2 months.  Does endorse pain will radiate to her right foot as well. States that she almost fell, but caught herself 2 months ago and the pain started soon after. Describes the pain as cramping, pulling ? ?Denies numbness and tingling.  ? ?Has tried massage, icing, Voltaren gel and aspirin without relief. ? ?Due to language barrier, an interpreter was present during the history-taking and subsequent discussion (and for part of the physical exam) with this patient. ? ? ?Past Medical History:  ?Diagnosis Date  ? Allergy   ? Asthma   ? Bronchitis   ? Diabetes mellitus without complication ([place])   ? GERD (gastroesophageal reflux disease)   ? Hyperlipidemia   ? Rosacea   ? Rosacea 10/21/2014  ? Vaginal itching 10/25/2012  ? ?Social History  ? ?Socioeconomic History  ? Marital status: Married  ?  Spouse name: Not on file  ? Number of children: 3  ? Years of education: Not on file  ? Highest education level: 8th grade  ?Occupational History  ? Not on file  ?Tobacco Use  ? Smoking status: Never  ? Smokeless tobacco: Never  ?Vaping Use  ? Vaping Use: Never used  ?Substance and Sexual Activity  ? Alcohol use: Yes  ?  Comment: rarely  ? Drug use: No  ? Sexual activity: Yes  ?  Birth control/protection: Condom  ?Other Topics Concern  ? Not on file  ?Social History Narrative  ? Not on file  ? ?Social Determinants of Health  ? ?Financial Resource Strain: Not on file  ?Food Insecurity: No Food Insecurity  ? Worried About Charity fundraiser in the Last Year: Never true  ? Ran Out of Food in the Last Year: Never true  ?Transportation Needs: No Transportation Needs  ? Lack of Transportation (Medical): No  ?  Lack of Transportation (Non-Medical): No  ?Physical Activity: Not on file  ?Stress: Not on file  ?Social Connections: Not on file  ?Intimate Partner Violence: Not on file  ? ?Family History  ?Problem Relation Age of Onset  ? Diabetes Mother   ? Allergic Disorder Mother   ? Diabetes Sister   ? Diabetes Brother   ? Diabetes Brother   ? Diabetes Sister   ? Diabetes Sister   ? Diabetes Sister   ? Diabetes Sister   ? ?Allergies  ?Allergen Reactions  ? Other   ?  Caffeine & spicy foods flare up Rosacea  ? ?  ? ?Review of Systems  ?Constitutional: Negative.   ?HENT: Negative.    ?Eyes: Negative.   ?Respiratory:  Negative for shortness of breath.   ?Cardiovascular:  Negative for chest pain and leg swelling.  ?Gastrointestinal: Negative.  Negative for abdominal pain, constipation and diarrhea.  ?Genitourinary:  Negative for dysuria.  ?Musculoskeletal:  Positive for back pain and myalgias.  ?Skin: Negative.   ?Neurological: Negative.   ?Endo/Heme/Allergies: Negative.   ?Psychiatric/Behavioral: Negative.    ? ?  ?Objective:  ?  ? ?BP 131/83 (BP Location: Left Arm, Patient Position: Sitting, Cuff Size: Normal)   Pulse 67   Temp 98.2 ?F (  36.8 ?C) (Oral)   Resp 18   Ht '5\' 3"'$  (1.6 m)   Wt 178 lb (80.7 kg)   LMP 08/19/2021   SpO2 98%   BMI 31.53 kg/m?  ? ? ?Physical Exam ?Vitals and nursing note reviewed.  ?Constitutional:   ?   Appearance: Normal appearance.  ?HENT:  ?   Head: Normocephalic and atraumatic.  ?   Right Ear: External ear normal.  ?   Left Ear: External ear normal.  ?   Nose: Nose normal.  ?   Mouth/Throat:  ?   Mouth: Mucous membranes are moist.  ?   Pharynx: Oropharynx is clear.  ?Eyes:  ?   Extraocular Movements: Extraocular movements intact.  ?   Conjunctiva/sclera: Conjunctivae normal.  ?   Pupils: Pupils are equal, round, and reactive to light.  ?Cardiovascular:  ?   Rate and Rhythm: Normal rate and regular rhythm.  ?   Pulses: Normal pulses.  ?   Heart sounds: Normal heart sounds.  ?Pulmonary:  ?    Effort: Pulmonary effort is normal.  ?   Breath sounds: Normal breath sounds.  ?Musculoskeletal:  ?   Cervical back: Normal, normal range of motion and neck supple.  ?   Thoracic back: Normal.  ?   Lumbar back: Bony tenderness present. No swelling. Decreased range of motion.  ?   Comments: Pain elicited with range of motion testing  ?Skin: ?   General: Skin is warm and dry.  ?Neurological:  ?   General: No focal deficit present.  ?   Mental Status: She is alert and oriented to person, place, and time.  ?Psychiatric:     ?   Mood and Affect: Mood normal.     ?   Behavior: Behavior normal.     ?   Thought Content: Thought content normal.     ?   Judgment: Judgment normal.  ? ? ? ? ?  ?Assessment & Plan:  ? ?Problem List Items Addressed This Visit   ?None ?Visit Diagnoses   ? ? Acute right-sided low back pain with right-sided sciatica    -  Primary  ? Relevant Medications  ? cyclobenzaprine (FLEXERIL) 10 MG tablet  ? ketorolac (TORADOL) injection 60 mg (Completed)  ? methylPREDNISolone acetate (DEPO-MEDROL) injection 80 mg (Completed)  ? ?  ? ?1. Acute right-sided low back pain with right-sided sciatica ?Trial Flexeril, continue current supportive care.  Patient education given on supportive care, red flags for prompt reevaluation. ?- cyclobenzaprine (FLEXERIL) 10 MG tablet; Take 1 tablet (10 mg total) by mouth 3 (three) times daily as needed for muscle spasms.  Dispense: 30 tablet; Refill: 0 ?- ketorolac (TORADOL) injection 60 mg ?- methylPREDNISolone acetate (DEPO-MEDROL) injection 80 mg ? ? ?I have reviewed the patient's medical history (PMH, PSH, Social History, Family History, Medications, and allergies) , and have been updated if relevant. I spent 30 minutes reviewing chart and  face to face time with patient. ? ? ? ?Return if symptoms worsen or fail to improve.  ? ? ?Joshuan Bolander S Mayers, PA-C ? ?

## 2021-10-16 ENCOUNTER — Other Ambulatory Visit: Payer: Self-pay

## 2021-11-12 ENCOUNTER — Other Ambulatory Visit (HOSPITAL_COMMUNITY): Payer: Self-pay

## 2021-11-29 ENCOUNTER — Ambulatory Visit: Payer: Self-pay

## 2021-11-29 NOTE — Telephone Encounter (Signed)
  Chief Complaint: Poison Ivy rash Symptoms: rash on arch of foot and side Frequency: 8 days Pertinent Negatives: Patient denies infection Disposition: '[]'$ ED /'[]'$ Urgent Care (no appt availability in office) / '[]'$ Appointment(In office/virtual)/ '[]'$  [place] [facility]/ '[x]'$ Home Care/ '[]'$ Refused Recommended Disposition /'[]'$ [place] [facility]/ '[]'$  Follow-up with PCP Additional Notes: Pt was walking her dogs in sandals. She believes that she was exposed to poison Ivy. She has continued to wear these sandals, but has not washed them. Rash is about 85 days old. Pt has been using antibiotic ointment and antifungal  without relief. Pt will get hydrocortisone ointment or another poison ivy treatment from the drug store. Pt will wash the sandals. Pt will call back if needed.    Summary: Posion ivy advice   Pt is calling to report that she got poison ivy 8 days ago. Pt reports that it is itching and does not look good. No available appts. Please advise needing a Spanish interpretor      Reason for Disposition  Mild rash from poison ivy, oak or sumac  Answer Assessment - Initial Assessment Questions 1. APPEARANCE of RASH: "Describe the rash."      Blisters, itchy 2. LOCATION: "Where is the rash located?"  (e.g., face, genitals, hands, legs)     On her feet. Arch of foot and upper part 3. SIZE: "How large is the rash?"      2 cm 4. ONSET: "When did the rash begin?"      8 days ago 5. ITCHING: "Does the rash itch?" If Yes, ask: "How bad is it?"   - MILD - doesn't interfere with normal activities   - MODERATE-SEVERE: interferes with work, school, sleep, or other activities      mild 6. EXPOSURE:  "How were you exposed to the plant (poison ivy, poison oak, sumac)"  "When were you exposed?"       Walking her dog in sandals 7. PAST HISTORY: "Have you had a poison ivy rash before?" If Yes, ask: "How bad was it?"     yes 8. PREGNANCY: "Is there any chance you are pregnant?" "When was your last menstrual  period?"     no  Protocols used: [place] [facility]

## 2021-12-17 ENCOUNTER — Other Ambulatory Visit: Payer: Self-pay

## 2022-01-21 ENCOUNTER — Other Ambulatory Visit: Payer: Self-pay | Admitting: Obstetrics and Gynecology

## 2022-01-21 DIAGNOSIS — Z1231 Encounter for screening mammogram for malignant neoplasm of breast: Secondary | ICD-10-CM

## 2022-01-22 ENCOUNTER — Other Ambulatory Visit: Payer: Self-pay | Admitting: Obstetrics and Gynecology

## 2022-01-22 DIAGNOSIS — Z1231 Encounter for screening mammogram for malignant neoplasm of breast: Secondary | ICD-10-CM

## 2022-02-06 ENCOUNTER — Other Ambulatory Visit: Payer: Self-pay

## 2022-03-03 ENCOUNTER — Ambulatory Visit: Payer: No Typology Code available for payment source

## 2022-03-11 ENCOUNTER — Ambulatory Visit
Admission: RE | Admit: 2022-03-11 | Discharge: 2022-03-11 | Disposition: A | Discharge status: Home / Self Care | Payer: Self-pay | Source: Ambulatory Visit | Attending: Obstetrics and Gynecology | Admitting: Obstetrics and Gynecology

## 2022-03-11 ENCOUNTER — Ambulatory Visit: Payer: Self-pay | Admitting: *Deleted

## 2022-03-11 VITALS — BP 122/82 | Wt 183.0 lb

## 2022-03-11 DIAGNOSIS — Z1231 Encounter for screening mammogram for malignant neoplasm of breast: Secondary | ICD-10-CM

## 2022-03-11 DIAGNOSIS — Z1239 Encounter for other screening for malignant neoplasm of breast: Secondary | ICD-10-CM

## 2022-03-11 NOTE — Progress Notes (Signed)
[name] is a 51 y.o. female who presents to [facility] today with no complaints.    Pap Smear: Pap smear not completed today. Last Pap smear was 02/01/2019 at [facility] and was normal with negative HPV. Per patient has no history of an abnormal Pap smear. Last Pap smear result is available in Epic.   Physical exam: Breasts Breasts symmetrical. No skin abnormalities bilateral breasts. No nipple retraction bilateral breasts. No nipple discharge bilateral breasts. No lymphadenopathy. No lumps palpated bilateral breasts. No complaints of pain or tenderness on exam.  MS DIGITAL DIAG TOMO BILAT  Result Date: 02/28/2021 CLINICAL DATA:  Two year interval follow-up of likely benign masses involving the upper RIGHT breast, likely fibroadenomas or complicated cysts with abundant internal debris. Annual evaluation, LEFT breast. EXAM: DIGITAL DIAGNOSTIC BILATERAL MAMMOGRAM WITH TOMOSYNTHESIS AND CAD; ULTRASOUND RIGHT BREAST LIMITED TECHNIQUE: Bilateral digital diagnostic mammography and breast tomosynthesis was performed. The images were evaluated with computer-aided detection.; Targeted ultrasound examination of the right breast was performed. COMPARISON:  Previous exam(s). ACR Breast Density Category d: The breast tissue is extremely dense, which lowers the sensitivity of mammography. FINDINGS: Full field CC and MLO views of both breasts were obtained. RIGHT: The mass in the [place] is nearly completely obscured by the patient's dense fibroglandular tissue. No new or suspicious findings elsewhere. Targeted ultrasound is performed, again demonstrating the circumscribed oval parallel hypoechoic mass at the 11 o'clock position approximately 2 cm from nipple at middle to posterior depth, measuring approximately 1.4 x 1.0 x 1.3 cm (previously 1.5 x 0.9 x 1.5 cm on 08/11/2019 and 2.3 x 2.4 x 1.7 cm on 02/07/2019), demonstrating posterior acoustic enhancement and no internal  power Doppler flow. The previously identified circumscribed oval parallel hypoechoic mass at the 1 o'clock position approximately 2 cm from the nipple measures approximately 0.9 x 0.6 x 0.7 cm (previously 0.8 x 0.6 x 0.7 cm on 08/11/2019 and 1.0 x 0.6 x 1.0 cm on 02/06/2001), demonstrating mixed posterior characteristics and no internal power Doppler flow. LEFT: No findings suspicious for malignancy. IMPRESSION: 1. No mammographic or sonographic evidence of malignancy involving the RIGHT breast. 2. No mammographic evidence of malignancy involving the LEFT breast. 3. Stable to slight decrease in size of the masses in the upper RIGHT breast dating back to September, 2020, confirming benignity. RECOMMENDATION: Screening mammogram in one year.(Code:SM-B-01Y) I have discussed the findings and recommendations with the patient. Communication with the patient was achieved with the assistance of a certified interpreter. If applicable, a reminder letter will be sent to the patient regarding the next appointment. BI-RADS CATEGORY  2: Benign. Electronically Signed   By: Evangeline Dakin M.D.   On: 02/28/2021 14:41   MS DIGITAL DIAG TOMO BILAT  Result Date: 02/14/2019 CLINICAL DATA:  Patient presents with a palpable area of concern in the upper outer right breast. EXAM: DIGITAL DIAGNOSTIC BILATERAL MAMMOGRAM WITH CAD ULTRASOUND RIGHT BREAST COMPARISON:  Previous exam(s). ACR Breast Density Category c: The breast tissue is heterogeneously dense, which may obscure small masses. FINDINGS: Mammogram: At the palpable site of concern in the upper outer right breast there is suggestion of an oval mass that is obscured by surrounding tissue. This measures approximately 25 mm. Additionally noted is a smaller oval circumscribed mass in the upper inner right breast measuring approximately 10 mm. Mammographic images were processed with CAD. On physical exam, I palpate a smooth mobile mass in the upper outer right breast at the palpable  site of concern. Targeted ultrasound is performed in the right breast at 11 o'clock 2 cm from the nipple at the palpable site of concern demonstrating an oval circumscribed hypoechoic mass measuring 23 x 17 x 24 mm. This corresponds to the mammographic finding. Additionally in the right breast at 1 o'clock 2 cm from the nipple there is an oval circumscribed hypoechoic mass measuring 10 x 6 x 10 mm. This corresponds to the mammographic finding. IMPRESSION: 1. 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 This corresponds to the palpable area of concern. 2. 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 RECOMMENDATION: Targeted right breast ultrasound in 6 months to document stability of the two probably benign masses. I have discussed the findings and recommendations with the patient. If applicable, a reminder letter will be sent to the patient regarding the next appointment. BI-RADS CATEGORY  3: Probably benign. Electronically Signed   By: Audie Pinto M.D.   On: 02/07/2019 10:16         Pelvic/Bimanual Pap is not indicated today per BCCCP guidelines.   Smoking History: Patient has never smoked.   Patient Navigation: Patient education provided. Access to services provided for patient through [facility] program. Spanish interpreter Rudene Anda from [facility] provided.   Colorectal Cancer Screening: Per patient has never had colonoscopy completed. FIT Test completed 07/03/2021 that was negative. No complaints today.    Breast and Cervical Cancer Risk Assessment: Patient does not have family history of breast cancer, known genetic mutations, or radiation treatment to the chest before age 53. Patient does not have history of cervical dysplasia, immunocompromised, or DES exposure in-utero.  Risk Assessment     Risk Scores       03/11/2022 02/28/2021   Last edited by: Royston Bake,  CMA McGill, Sherie Mamie Nick, LPN   5-year risk: 0.7 % 0.7 %   Lifetime risk: 6.2 % 6.3 %            A: BCCCP exam without pap smear No complaints.  P: Referred patient to the [facility] for a screening mammogram on mobile unit. Appointment scheduled Tuesday, March 11, 2022 at 1520.  Loletta Parish, RN 03/11/2022 2:58 PM

## 2022-03-11 NOTE — Patient Instructions (Signed)
Explained breast self awareness with Tiffany Vazquez. Patient did not need a Pap smear today due to last Pap smear and HPV typing was 02/01/2019. Let her know BCCCP will cover Pap smears and HPV typing every 5 years unless has a history of abnormal Pap smears. Referred patient to the [facility] for a screening mammogram on mobile unit. Appointment scheduled Tuesday, March 11, 2022 at 1520. Patient aware of appointment and will be there. Let patient know the [facility] will follow up with her within the next couple weeks with results of her mammogram by letter or phone. Tiffany Vazquez verbalized understanding.  Jenice Leiner, Arvil [place]aco, RN 2:59 PM

## 2022-03-21 ENCOUNTER — Other Ambulatory Visit: Payer: Self-pay

## 2022-03-21 ENCOUNTER — Other Ambulatory Visit: Payer: Self-pay | Admitting: Pharmacist

## 2022-03-21 DIAGNOSIS — J3089 Other allergic rhinitis: Secondary | ICD-10-CM

## 2022-03-21 DIAGNOSIS — E1165 Type 2 diabetes mellitus with hyperglycemia: Secondary | ICD-10-CM

## 2022-03-21 MED ORDER — ALBUTEROL SULFATE HFA 108 (90 BASE) MCG/ACT IN AERS
2.0000 | INHALATION_SPRAY | Freq: Four times a day (QID) | RESPIRATORY_TRACT | 0 refills | Status: DC | PRN
  Filled 2022-03-21 – 2022-04-30 (×2): qty 6.7, 25d supply, fill #0

## 2022-03-21 MED ORDER — LORATADINE 10 MG PO TABS
10.0000 mg | ORAL_TABLET | Freq: Every day | ORAL | 0 refills | Status: AC
  Filled 2022-03-21: qty 100, 100d supply, fill #0

## 2022-03-21 MED ORDER — METFORMIN HCL 500 MG PO TABS
500.0000 mg | ORAL_TABLET | Freq: Two times a day (BID) | ORAL | 0 refills | Status: DC
  Filled 2022-04-30: qty 60, 30d supply, fill #0

## 2022-03-28 ENCOUNTER — Other Ambulatory Visit: Payer: Self-pay

## 2022-04-30 ENCOUNTER — Encounter: Payer: Self-pay | Admitting: Nurse Practitioner

## 2022-04-30 ENCOUNTER — Other Ambulatory Visit: Payer: Self-pay

## 2022-04-30 ENCOUNTER — Ambulatory Visit: Payer: Self-pay | Attending: Nurse Practitioner | Admitting: Nurse Practitioner

## 2022-04-30 VITALS — BP 127/76 | HR 66 | Ht 63.5 in | Wt 184.0 lb

## 2022-04-30 DIAGNOSIS — M5441 Lumbago with sciatica, right side: Secondary | ICD-10-CM

## 2022-04-30 DIAGNOSIS — H9311 Tinnitus, right ear: Secondary | ICD-10-CM

## 2022-04-30 DIAGNOSIS — R7303 Prediabetes: Secondary | ICD-10-CM

## 2022-04-30 DIAGNOSIS — E7800 Pure hypercholesterolemia, unspecified: Secondary | ICD-10-CM

## 2022-04-30 DIAGNOSIS — Z23 Encounter for immunization: Secondary | ICD-10-CM

## 2022-04-30 LAB — POCT GLYCOSYLATED HEMOGLOBIN (HGB A1C): HbA1c, POC (controlled diabetic range): 6.1 % (ref 0.0–7.0)

## 2022-04-30 MED ORDER — TETANUS-DIPHTH-ACELL PERTUSSIS 5-2-15.5 LF-MCG/0.5 IM SUSP: 0.5000 mL | Freq: Once | INTRAMUSCULAR | 0 refills | Status: AC

## 2022-04-30 MED ORDER — CYCLOBENZAPRINE HCL 5 MG PO TABS
5.0000 mg | ORAL_TABLET | Freq: Three times a day (TID) | ORAL | 3 refills | Status: DC | PRN
  Filled 2022-04-30: qty 60, 20d supply, fill #0

## 2022-04-30 MED ORDER — IBUPROFEN 600 MG PO TABS
600.0000 mg | ORAL_TABLET | Freq: Three times a day (TID) | ORAL | 0 refills | Status: AC
  Filled 2022-04-30: qty 42, 14d supply, fill #0

## 2022-04-30 MED ORDER — IBUPROFEN 600 MG PO TABS
600.0000 mg | ORAL_TABLET | Freq: Three times a day (TID) | ORAL | 0 refills | Status: DC
  Filled 2022-04-30: qty 42, 14d supply, fill #0

## 2022-04-30 MED ORDER — METFORMIN HCL 500 MG PO TABS: 500.0000 mg | ORAL_TABLET | Freq: Two times a day (BID) | ORAL | 1 refills | Status: DC

## 2022-04-30 NOTE — Progress Notes (Signed)
Assessment & Plan:  Tiffany Vazquez was seen today for prediabetes.  Diagnoses and all orders for this visit:  Prediabetes -     POCT glycosylated hemoglobin (Hb A1C) -     CMP14+EGFR -     metFORMIN (GLUCOPHAGE) 500 MG tablet; Take 1 tablet (500 mg total) by mouth 2 (two) times daily with a meal.  Need for Tdap vaccination -     Tdap (ADACEL) 09-17-13.5 LF-MCG/0.5 injection; Inject 0.5 mLs into the muscle once for 1 dose.  Tinnitus of right ear -     Ambulatory referral to ENT -     ibuprofen (ADVIL) 600 MG tablet; Take 1 tablet (600 mg total) by mouth every 8 (eight) hours for 14 days. FOR TINNITUS  Hypercholesterolemia -     Lipid panel  Acute right-sided low back pain with right-sided sciatica Will try gabapentin if no improvement in symptoms  -     cyclobenzaprine (FLEXERIL) 5 MG tablet; Take 1 tablet (5 mg total) by mouth 3 (three) times daily as needed for muscle spasms.    Patient has been counseled on age-appropriate routine health concerns for screening and prevention. These are reviewed and up-to-date. Referrals have been placed accordingly. Immunizations are up-to-date or declined.    Subjective:   Chief Complaint  Patient presents with   Prediabetes   HPI Tiffany Vazquez Vazquez 51 y.o. female presents to office today for follow up to prediabetes and with complaints of re occurring right ear tinnitus.   Prediabetes Well controlled with metformin 500 mg BID. She states she was told she had diabetes many years ago with an A1c of 6.6 however I am unable to locate any record of this.  Lab Results  Component Value Date   HGBA1C 6.1 04/30/2022    Lab Results  Component Value Date   HGBA1C 6.1 (H) 06/26/2021    Tinnitus of right ear She was previously evaluated by ENT for this in 2018 PER ENT records 03-30-2017:  Tiffany Vazquez is a 51 y.o. female who presents for tinnitus and ear/jaw pain. She was seen recently for a sudden idiopathic sensorineural hearing loss  about a month ago. Patient has a history of right sided sudden hearing loss in January 2018. She was fit for one hearing aid at [employer] over the summer. In September she experienced a sudden drop in hearing in her left ear. Patient completed a course of high dose prednisone. MRI of the brain was normal. She was medically cleared for bilateral hearing aids; she completed an application for the [employer] program last week.  She returns today with two main concerns: bilateral, non-pulsatile tinnitus over the past several months and intermittent bilateral ear/jaw pain. Tinnitus sounds like a static and is there all the time. Started in both ears at the time that hearing suddenly dropped. She also has pain in both ears/jaw a few times per week for the past few months. Sometimes her jaw locks. Pain radiates up the temples and down beneath the mastoids. Frequency is a few times per week. She completed two weeks of high dose Ibuprofen, 622m TID which alleviated the pain temporarily. She has taken Advil previously and it would help somewhat. She saw her dentist recently who advised she see an oral surgeon for possible TMJ dysfunction.  She denies fever, nasal obstruction, rhinorrhea, facial pain/pressure, sore throat, dental pain, dysphagia, odynophagia, hoarseness, cough or neck pain. Today she reports her symptoms of non pulsatile tinnitus and ear/jaw pain have  returned and she no longer uses the hearing aids as she does not have any issues with hearing loss. Unfortunately she is uninsured and can not afford the out of pocket cost for ENT office visit. We will check with [facility] to see if they have a patient assistance program she can apply for as ENT visits are not covered under our financial assistance program    Back pain with right sided sciatica She was prescribed flexeril 74m however she reports it made her very sleepy. We will try 5 mg instead. If no improvement we can try her on gabapentin. Onset  of pain several months ago when she reports she almost fell but caught herself. Since then the pain has not improved. Radiates from her right buttock down her entire leg. Treatments tried: voltaren gel, ASA.    Review of Systems  Constitutional:  Negative for fever, malaise/fatigue and weight loss.  HENT:  Positive for ear pain and tinnitus. Negative for congestion, ear discharge, hearing loss, nosebleeds, sinus pain and sore throat.   Eyes: Negative.  Negative for blurred vision, double vision and photophobia.  Respiratory: Negative.  Negative for cough, shortness of breath and stridor.   Cardiovascular: Negative.  Negative for chest pain, palpitations and leg swelling.  Gastrointestinal: Negative.  Negative for heartburn, nausea and vomiting.  Musculoskeletal:  Positive for back pain and joint pain. Negative for myalgias.  Neurological: Negative.  Negative for dizziness, focal weakness, seizures and headaches.  Psychiatric/Behavioral: Negative.  Negative for suicidal ideas.     Past Medical History:  Diagnosis Date   Allergy    Asthma    Bronchitis    Diabetes mellitus without complication (HCC)    GERD (gastroesophageal reflux disease)    Hyperlipidemia    Rosacea    Rosacea 10/21/2014   Vaginal itching 10/25/2012    Past Surgical History:  Procedure Laterality Date   CESAREAN SECTION     2 previous   DILATION AND CURETTAGE OF UTERUS     2 previous    Family History  Problem Relation Age of Onset   Diabetes Mother    Allergic Disorder Mother    Diabetes Sister    Diabetes Sister    Diabetes Sister    Diabetes Sister    Diabetes Sister    Diabetes Brother    Diabetes Brother    Breast cancer Neg Hx     Social History Reviewed with no changes to be made today.   Outpatient Medications Prior to Visit  Medication Sig Dispense Refill   albuterol (PROVENTIL HFA) 108 (90 Base) MCG/ACT inhaler Inhale 2 puffs into the lungs every 6 (six) hours as needed for wheezing or  shortness of breath. 6.7 g 0   loratadine (CLARITIN) 10 MG tablet Take 1 tablet (10 mg total) by mouth daily. 100 tablet 0   simvastatin (ZOCOR) 10 MG tablet Take 1 tablet (10 mg total) by mouth daily. 90 tablet 2   metFORMIN (GLUCOPHAGE) 500 MG tablet Take 1 tablet (500 mg total) by mouth 2 (two) times daily with a meal. 60 tablet 0   cyclobenzaprine (FLEXERIL) 10 MG tablet Take 1 tablet (10 mg total) by mouth 3 (three) times daily as needed for muscle spasms. (Patient not taking: Reported on 04/30/2022) 30 tablet 0   diclofenac Sodium (VOLTAREN) 1 % GEL Apply 2 grams four times daily. Rub into affected area of foot 2 to 4 times daily. Do not use if taking oral anti-inflammatory medication. (Patient not taking: Reported  on 04/30/2022) 100 g 2   fluticasone (FLONASE) 50 MCG/ACT nasal spray Place 2 sprays into both nostrils daily. (Patient not taking: Reported on 04/30/2022) 16 g 2   No facility-administered medications prior to visit.    Allergies  Allergen Reactions   Other     Caffeine & spicy foods flare up Rosacea       Objective:    BP 127/76   Pulse 66   Ht 5' 3.5" (1.613 m)   Wt 184 lb (83.5 kg)   LMP 04/08/2022   SpO2 100%   BMI 32.08 kg/m  Wt Readings from Last 3 Encounters:  04/30/22 184 lb (83.5 kg)  03/11/22 183 lb (83 kg)  09/09/21 178 lb (80.7 kg)    Physical Exam Vitals and nursing note reviewed.  Constitutional:      Appearance: She is well-developed.  HENT:     Head: Normocephalic and atraumatic.     Right Ear: Hearing, tympanic membrane, ear canal and external ear normal.     Left Ear: Hearing, tympanic membrane, ear canal and external ear normal.  Cardiovascular:     Rate and Rhythm: Normal rate and regular rhythm.     Heart sounds: Normal heart sounds. No murmur heard.    No friction rub. No gallop.  Pulmonary:     Effort: Pulmonary effort is normal. No tachypnea or respiratory distress.     Breath sounds: Normal breath sounds. No decreased breath  sounds, wheezing, rhonchi or rales.  Chest:     Chest wall: No tenderness.  Abdominal:     General: Bowel sounds are normal.     Palpations: Abdomen is soft.  Musculoskeletal:        General: Normal range of motion.     Cervical back: Normal range of motion.  Skin:    General: Skin is warm and dry.  Neurological:     Mental Status: She is alert and oriented to person, place, and time.     Coordination: Coordination normal.  Psychiatric:        Behavior: Behavior normal. Behavior is cooperative.        Thought Content: Thought content normal.        Judgment: Judgment normal.          Patient has been counseled extensively about nutrition and exercise as well as the importance of adherence with medications and regular follow-up. The patient was given clear instructions to go to ER or return to medical center if symptoms don't improve, worsen or new problems develop. The patient verbalized understanding.   Follow-up: Return in about 6 months (around 10/30/2022).   Gildardo Pounds, FNP-BC [facility] and [facility] [place], [place]   04/30/2022, 6:12 PM

## 2022-04-30 NOTE — Progress Notes (Signed)
Pt has ear pain I both ears. Right leg pain, gel not helping.

## 2022-05-01 LAB — CMP14+EGFR
ALT: 19 IU/L (ref 0–32)
AST: 15 IU/L (ref 0–40)
Albumin/Globulin Ratio: 1.8 (ref 1.2–2.2)
Albumin: 4.6 g/dL (ref 3.8–4.9)
Alkaline Phosphatase: 84 IU/L (ref 44–121)
BUN/Creatinine Ratio: 21 (ref 9–23)
BUN: 21 mg/dL (ref 6–24)
Bilirubin Total: 0.2 mg/dL (ref 0.0–1.2)
CO2: 27 mmol/L (ref 20–29)
Calcium: 9.7 mg/dL (ref 8.7–10.2)
Chloride: 99 mmol/L (ref 96–106)
Creatinine, Ser: 1 mg/dL (ref 0.57–1.00)
Globulin, Total: 2.6 g/dL (ref 1.5–4.5)
Glucose: 116 mg/dL — ABNORMAL HIGH (ref 70–99)
Potassium: 4.4 mmol/L (ref 3.5–5.2)
Sodium: 140 mmol/L (ref 134–144)
Total Protein: 7.2 g/dL (ref 6.0–8.5)
eGFR: 68 mL/min/{1.73_m2} (ref >59)

## 2022-05-01 LAB — LIPID PANEL
Chol/HDL Ratio: 2.7 ratio (ref 0.0–4.4)
Cholesterol, Total: 189 mg/dL (ref 100–199)
HDL: 71 mg/dL (ref >39)
LDL Chol Calc (NIH): 98 mg/dL (ref 0–99)
Triglycerides: 115 mg/dL (ref 0–149)
VLDL Cholesterol Cal: 20 mg/dL (ref 5–40)

## 2022-05-06 ENCOUNTER — Other Ambulatory Visit: Payer: Self-pay

## 2022-09-25 ENCOUNTER — Other Ambulatory Visit: Payer: Self-pay

## 2022-09-25 ENCOUNTER — Other Ambulatory Visit: Payer: Self-pay | Admitting: Family Medicine

## 2022-09-25 MED ORDER — ALBUTEROL SULFATE HFA 108 (90 BASE) MCG/ACT IN AERS
2.0000 | INHALATION_SPRAY | Freq: Four times a day (QID) | RESPIRATORY_TRACT | 0 refills | Status: AC | PRN
  Filled 2022-09-25: qty 6.7, 25d supply, fill #0

## 2022-09-26 ENCOUNTER — Other Ambulatory Visit: Payer: Self-pay

## 2022-09-29 ENCOUNTER — Other Ambulatory Visit: Payer: Self-pay

## 2022-10-10 ENCOUNTER — Ambulatory Visit: Payer: Self-pay | Attending: Nurse Practitioner

## 2022-10-10 ENCOUNTER — Telehealth: Payer: Self-pay | Admitting: Nurse Practitioner

## 2022-10-10 ENCOUNTER — Ambulatory Visit: Payer: Self-pay

## 2022-10-10 NOTE — Telephone Encounter (Signed)
Completed - Called pt to inform of [employer], to reapply if income changes, and mailing denial letter for [facility]. Pt has appt w/ PCP on 10/31/22 - plan to meet w/ pt to begin [facility] and collect docs.

## 2022-10-31 ENCOUNTER — Ambulatory Visit: Payer: Self-pay | Attending: Nurse Practitioner | Admitting: Nurse Practitioner

## 2022-10-31 ENCOUNTER — Ambulatory Visit: Payer: Self-pay

## 2022-10-31 ENCOUNTER — Encounter: Payer: Self-pay | Admitting: Nurse Practitioner

## 2022-10-31 VITALS — BP 118/68 | HR 73 | Ht 63.0 in | Wt 188.8 lb

## 2022-10-31 DIAGNOSIS — R7303 Prediabetes: Secondary | ICD-10-CM

## 2022-10-31 LAB — POCT GLYCOSYLATED HEMOGLOBIN (HGB A1C): HbA1c, POC (prediabetic range): 6.4 % (ref 5.7–6.4)

## 2022-10-31 NOTE — Progress Notes (Signed)
Assessment & Plan:  [name] was seen today for prediabetes.  Diagnoses and all orders for this visit:  Prediabetes -     POCT glycosylated hemoglobin (Hb A1C) -     CMP14+EGFR Continue blood sugar control as discussed in office today, low carbohydrate diet, and regular physical exercise as tolerated, 150 minutes per week (30 min each day, 5 days per week, or 50 min 3 days per week).    Patient has been counseled on age-appropriate routine health concerns for screening and prevention. These are reviewed and up-to-date. Referrals have been placed accordingly. Immunizations are up-to-date or declined.    Subjective:   Chief Complaint  Patient presents with   Prediabetes   HPI Tiffany Vazquez 52 y.o. female presents to office today for follow up to prediabetes.   VRI was used to communicate directly with patient for the entire encounter including providing detailed patient instructions.     Prediabetes A1c has increased slightly as well as weight. She endorses eating sugary snacks and finds it hard to resist them when she is craving something sweet. She is no longer taking simvastatin or metformin.  Lab Results  Component Value Date   HGBA1C 6.4 10/31/2022    The 10-year ASCVD risk score (Arnett DK, et al., 2019) is: 1.5%   Values used to calculate the score:     Age: 52 years     Sex: Female     Is Non-Hispanic African American: No     Diabetic: Yes     Tobacco smoker: No     Systolic Blood Pressure: 118 mmHg     Is BP treated: No     HDL Cholesterol: 71 mg/dL     Total Cholesterol: 189 mg/dL    She has been experiencing frequent hot flashes due to menopausal symptoms. Declines SSRI today. States she has taken an SSRI in the past and did not like the way it made her feel.    Review of Systems  Constitutional:  Negative for fever, malaise/fatigue and weight loss.  HENT: Negative.  Negative for nosebleeds.   Eyes: Negative.  Negative for blurred vision,  double vision and photophobia.  Respiratory: Negative.  Negative for cough and shortness of breath.   Cardiovascular: Negative.  Negative for chest pain, palpitations and leg swelling.  Gastrointestinal: Negative.  Negative for heartburn, nausea and vomiting.  Musculoskeletal: Negative.  Negative for myalgias.  Neurological: Negative.  Negative for dizziness, focal weakness, seizures and headaches.  Psychiatric/Behavioral: Negative.  Negative for suicidal ideas.     Past Medical History:  Diagnosis Date   Allergy    Asthma    Bronchitis    Diabetes mellitus without complication (HCC)    GERD (gastroesophageal reflux disease)    Hyperlipidemia    Rosacea    Rosacea 10/21/2014   Vaginal itching 10/25/2012    Past Surgical History:  Procedure Laterality Date   CESAREAN SECTION     2 previous   DILATION AND CURETTAGE OF UTERUS     2 previous    Family History  Problem Relation Age of Onset   Diabetes Mother    Allergic Disorder Mother    Diabetes Sister    Diabetes Sister    Diabetes Sister    Diabetes Sister    Diabetes Sister    Diabetes Brother    Diabetes Brother    Breast cancer Neg Hx     Social History Reviewed with no changes to be made today.  Outpatient Medications Prior to Visit  Medication Sig Dispense Refill   albuterol (PROVENTIL HFA) 108 (90 Base) MCG/ACT inhaler Inhale 2 puffs into the lungs every 6 (six) hours as needed for wheezing or shortness of breath. 6.7 g 0   loratadine (CLARITIN) 10 MG tablet Take 1 tablet (10 mg total) by mouth daily. 100 tablet 0   metFORMIN (GLUCOPHAGE) 500 MG tablet Take 1 tablet (500 mg total) by mouth 2 (two) times daily with a meal. (Patient not taking: Reported on 10/31/2022) 180 tablet 1   simvastatin (ZOCOR) 10 MG tablet Take 1 tablet (10 mg total) by mouth daily. 90 tablet 2   cyclobenzaprine (FLEXERIL) 5 MG tablet Take 1 tablet (5 mg total) by mouth 3 (three) times daily as needed for muscle spasms. (Patient not  taking: Reported on 10/31/2022) 60 tablet 3   No facility-administered medications prior to visit.    Allergies  Allergen Reactions   Other     Caffeine & spicy foods flare up Rosacea       Objective:    BP 118/68 (BP Location: Left Arm, Patient Position: Sitting, Cuff Size: Normal)   Pulse 73   Ht 5\' 3"  (1.6 m)   Wt 188 lb 12.8 oz (85.6 kg)   LMP 08/27/2022 (Approximate)   SpO2 97%   BMI 33.44 kg/m  Wt Readings from Last 3 Encounters:  10/31/22 188 lb 12.8 oz (85.6 kg)  04/30/22 184 lb (83.5 kg)  03/11/22 183 lb (83 kg)    Physical Exam Vitals and nursing note reviewed.  Constitutional:      Appearance: She is well-developed.  HENT:     Head: Normocephalic and atraumatic.  Cardiovascular:     Rate and Rhythm: Normal rate and regular rhythm.     Heart sounds: Normal heart sounds. No murmur heard.    No friction rub. No gallop.  Pulmonary:     Effort: Pulmonary effort is normal. No tachypnea or respiratory distress.     Breath sounds: Normal breath sounds. No decreased breath sounds, wheezing, rhonchi or rales.  Chest:     Chest wall: No tenderness.  Abdominal:     General: Bowel sounds are normal.     Palpations: Abdomen is soft.  Musculoskeletal:        General: Normal range of motion.     Cervical back: Normal range of motion.  Skin:    General: Skin is warm and dry.  Neurological:     Mental Status: She is alert and oriented to person, place, and time.     Coordination: Coordination normal.  Psychiatric:        Behavior: Behavior normal. Behavior is cooperative.        Thought Content: Thought content normal.        Judgment: Judgment normal.          Patient has been counseled extensively about nutrition and exercise as well as the importance of adherence with medications and regular follow-up. The patient was given clear instructions to go to ER or return to medical center if symptoms don't improve, worsen or new problems develop. The patient  verbalized understanding.   Follow-up: Return in about 6 months (around 05/02/2023) for physical.   Claiborne Rigg, FNP-BC [facility] and [facility] [place], [place] [phone]   10/31/2022, 4:32 PM

## 2022-11-01 LAB — CMP14+EGFR
ALT: 24 IU/L (ref 0–32)
AST: 21 IU/L (ref 0–40)
Albumin: 4.1 g/dL (ref 3.8–4.9)
Alkaline Phosphatase: 82 IU/L (ref 44–121)
BUN/Creatinine Ratio: 22 (ref 9–23)
BUN: 20 mg/dL (ref 6–24)
Bilirubin Total: <0.2 mg/dL (ref 0.0–1.2)
CO2: 23 mmol/L (ref 20–29)
Calcium: 9.4 mg/dL (ref 8.7–10.2)
Chloride: 102 mmol/L (ref 96–106)
Creatinine, Ser: 0.92 mg/dL (ref 0.57–1.00)
Globulin, Total: 2.9 g/dL (ref 1.5–4.5)
Glucose: 153 mg/dL — ABNORMAL HIGH (ref 70–99)
Potassium: 4.4 mmol/L (ref 3.5–5.2)
Sodium: 140 mmol/L (ref 134–144)
Total Protein: 7 g/dL (ref 6.0–8.5)
eGFR: 75 mL/min/{1.73_m2} (ref >59)

## 2023-05-08 ENCOUNTER — Encounter: Payer: Self-pay | Admitting: Nurse Practitioner

## 2023-05-08 ENCOUNTER — Encounter: Payer: Self-pay | Admitting: Internal Medicine

## 2023-06-23 ENCOUNTER — Ambulatory Visit: Payer: Self-pay | Admitting: Nurse Practitioner

## 2023-09-14 ENCOUNTER — Encounter (HOSPITAL_COMMUNITY): Payer: Self-pay | Admitting: Emergency Medicine

## 2023-09-14 ENCOUNTER — Other Ambulatory Visit: Payer: Self-pay

## 2023-09-14 ENCOUNTER — Ambulatory Visit (HOSPITAL_COMMUNITY)
Admission: EM | Admit: 2023-09-14 | Discharge: 2023-09-14 | Disposition: A | Discharge status: Home / Self Care | Payer: Self-pay | Attending: Family Medicine | Admitting: Family Medicine

## 2023-09-14 ENCOUNTER — Ambulatory Visit (INDEPENDENT_AMBULATORY_CARE_PROVIDER_SITE_OTHER): Payer: Self-pay

## 2023-09-14 DIAGNOSIS — J40 Bronchitis, not specified as acute or chronic: Secondary | ICD-10-CM

## 2023-09-14 DIAGNOSIS — R051 Acute cough: Secondary | ICD-10-CM

## 2023-09-14 DIAGNOSIS — J4541 Moderate persistent asthma with (acute) exacerbation: Secondary | ICD-10-CM

## 2023-09-14 DIAGNOSIS — J329 Chronic sinusitis, unspecified: Secondary | ICD-10-CM

## 2023-09-14 LAB — POCT FASTING CBG KUC MANUAL ENTRY: POCT Glucose (KUC): 107 mg/dL — AB (ref 70–99)

## 2023-09-14 MED ORDER — IPRATROPIUM-ALBUTEROL 0.5-2.5 (3) MG/3ML IN SOLN
RESPIRATORY_TRACT | Status: AC
  Filled 2023-09-14: qty 3

## 2023-09-14 MED ORDER — PREDNISONE 20 MG PO TABS
40.0000 mg | ORAL_TABLET | Freq: Every day | ORAL | 0 refills | Status: AC
  Filled 2023-09-14: qty 8, 4d supply, fill #0

## 2023-09-14 MED ORDER — BUDESONIDE-FORMOTEROL FUMARATE 160-4.5 MCG/ACT IN AERO
2.0000 | INHALATION_SPRAY | Freq: Two times a day (BID) | RESPIRATORY_TRACT | 0 refills | Status: AC
  Filled 2023-09-14: qty 10.2, 30d supply, fill #0

## 2023-09-14 MED ORDER — METHYLPREDNISOLONE SODIUM SUCC 125 MG IJ SOLR
INTRAMUSCULAR | Status: AC
  Filled 2023-09-14: qty 2

## 2023-09-14 MED ORDER — BENZONATATE 100 MG PO CAPS
100.0000 mg | ORAL_CAPSULE | Freq: Three times a day (TID) | ORAL | 0 refills | Status: DC
  Filled 2023-09-14: qty 21, 7d supply, fill #0

## 2023-09-14 MED ORDER — IPRATROPIUM-ALBUTEROL 0.5-2.5 (3) MG/3ML IN SOLN
3.0000 mL | Freq: Once | RESPIRATORY_TRACT | Status: AC
  Administered 2023-09-14: 3 mL via RESPIRATORY_TRACT

## 2023-09-14 MED ORDER — AMOXICILLIN-POT CLAVULANATE 875-125 MG PO TABS
1.0000 | ORAL_TABLET | Freq: Two times a day (BID) | ORAL | 0 refills | Status: DC
  Filled 2023-09-14: qty 14, 7d supply, fill #0

## 2023-09-14 MED ORDER — METHYLPREDNISOLONE SODIUM SUCC 125 MG IJ SOLR
60.0000 mg | Freq: Once | INTRAMUSCULAR | Status: AC
  Administered 2023-09-14: 60 mg via INTRAMUSCULAR

## 2023-09-14 NOTE — ED Notes (Signed)
 Patient is in xray

## 2023-09-14 NOTE — ED Provider Notes (Signed)
 [facility]    CSN: [id number] Arrival date & time: 09/14/23  1311      History   Chief Complaint Chief Complaint  Patient presents with   Cough    HPI Tiffany Vazquez is a 53 y.o. female.   Patient presents today with a 3-week history of congestion and cough.  She is Spanish-speaking and video interpreter was utilized during visit.  She initially had some mild fever but this is since resolved.  She denies any known sick contacts.  She has never had COVID.  She does have a history of asthma and has been using her Symbicort without improvement of symptoms.  She has also tried multiple over-the-counter medications including nasal decongestant, Mucinex, Tylenol.  She reports chest tightness and burning sensation with coughing that is preventing her from sleeping at night.  She denies hospitalization related to asthma.  Denies any recent antibiotics or steroids.  She denies history of diabetes; reports history of prediabetes.  Her last A1c was 6.4% 10 months ago.  She is having difficulty with her daily activities as result of symptoms.    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 Authorizing Provider  amoxicillin -clavulanate (AUGMENTIN ) 875-125 MG tablet Take 1 tablet by mouth every 12 (twelve) hours. 09/14/23  Yes Rushie Brazel K, PA-C  benzonatate  (TESSALON ) 100 MG capsule Take 1 capsule (100 mg total) by mouth every 8 (eight) hours. 09/14/23  Yes Delynda Sepulveda, Betsey Brow, PA-C  budesonide-formoterol (SYMBICORT) 160-4.5 MCG/ACT inhaler Inhale 2 puffs into the lungs in the morning and at bedtime. 09/14/23  Yes Mylia Pondexter K, PA-C  predniSONE  (DELTASONE ) 20 MG tablet Take 2 tablets (40 mg total) by mouth daily for 4 days. 09/14/23 09/18/23 Yes Dhruvan Gullion, Betsey Brow, PA-C  albuterol  (PROVENTIL  HFA) 108 (90 Base) MCG/ACT inhaler Inhale 2 puffs into the lungs every 6 (six) hours as needed for wheezing or shortness of breath. 09/25/22   Newlin, Enobong, MD  loratadine  (CLARITIN ) 10 MG  tablet Take 1 tablet (10 mg total) by mouth daily. 03/21/22   Newlin, Enobong, MD  cetirizine  (ZYRTEC ) 10 MG tablet Take 1 tablet (10 mg total) by mouth daily. One tab daily for allergies 09/27/11 06/11/12  Estrella Hench, MD    Family History Family History  Problem Relation Age of Onset   Diabetes Mother    Allergic Disorder Mother    Diabetes Sister    Diabetes Sister    Diabetes Sister    Diabetes Sister    Diabetes Sister    Diabetes Brother    Diabetes Brother    Breast cancer Neg Hx     Social History Social History   Tobacco Use   Smoking status: Never   Smokeless tobacco: Never  Vaping Use   Vaping status: Never Used  Substance Use Topics   Alcohol use: Yes    Comment: rarely   Drug use: No     Allergies   Other   Review  of Systems Review of Systems  Constitutional:  Positive for activity change. Negative for appetite change, fatigue and fever (Resolved).  HENT:  Positive for congestion, postnasal drip, sinus pressure and sore throat. Negative for sneezing.   Respiratory:  Positive for cough, chest tightness and wheezing. Negative for shortness of breath.   Cardiovascular:  Negative for chest pain.  Gastrointestinal:  Negative for abdominal pain, diarrhea, nausea and vomiting.  Neurological:  Negative for dizziness, light-headedness and headaches.     Physical Exam Triage Vital Signs ED Triage Vitals  Encounter Vitals Group     BP 09/14/23 1350 122/74     Systolic BP Percentile --      Diastolic BP Percentile --      Pulse Rate 09/14/23 1350 88     Resp 09/14/23 1350 18     Temp 09/14/23 1350 98.1 F (36.7 C)     Temp Source 09/14/23 1350 Oral     SpO2 09/14/23 1350 94 %     Weight --      Height --      Head Circumference --      Peak Flow --      Pain Score 09/14/23 1346 2     Pain Loc --      Pain Education --      Exclude from Growth Chart --    No data found.  Updated Vital Signs BP 122/74 (BP Location: Right Arm)   Pulse 88   Temp 98.1 F (36.7 C) (Oral)   Resp 18   SpO2 94%   Visual Acuity Right Eye Distance:   Left Eye Distance:   Bilateral Distance:    Right Eye Near:   Left Eye Near:    Bilateral Near:     Physical Exam Vitals reviewed.  Constitutional:      General: She is awake. She is not in acute distress.    Appearance: Normal appearance. She is well-developed. She is not ill-appearing.     Comments: Very pleasant female appears stated age in no acute distress sitting comfortably in exam room  HENT:     Head: Normocephalic and atraumatic.     Right Ear: Tympanic membrane, ear canal and external ear normal. Tympanic membrane is not erythematous or bulging.     Left Ear: Tympanic membrane, ear canal and external ear normal. Tympanic membrane is not  erythematous or bulging.     Nose:     Right Sinus: Maxillary sinus tenderness present. No frontal sinus tenderness.  Left Sinus: Maxillary sinus tenderness present. No frontal sinus tenderness.     Mouth/Throat:     Pharynx: Uvula midline. Postnasal drip present. No oropharyngeal exudate or posterior oropharyngeal erythema.  Cardiovascular:     Rate and Rhythm: Normal rate and regular rhythm.     Heart sounds: Normal heart sounds, S1 normal and S2 normal. No murmur heard. Pulmonary:     Effort: Pulmonary effort is normal.     Breath sounds: Wheezing present. No rhonchi or rales.     Comments: Reactive cough with deep breathing.  Widespread wheezing Psychiatric:        Behavior: Behavior is cooperative.      UC Treatments / Results  Labs (all labs ordered are listed, but only abnormal results are displayed) Labs Reviewed  POCT FASTING CBG [facility] MANUAL ENTRY - Abnormal; Notable for the following components:      Result Value   POCT Glucose ([facility]) 107 (*)    All other components within normal limits    EKG   Radiology DG Chest 2 View Result Date: 09/14/2023 CLINICAL DATA:  3 weeks of cough EXAM: CHEST - 2 VIEW COMPARISON:  08/28/2014. FINDINGS: Bilateral lung fields are clear. Bilateral costophrenic angles are clear. Normal cardio-mediastinal silhouette. No acute osseous abnormalities. The soft tissues are within normal limits. IMPRESSION: No active cardiopulmonary disease. Electronically Signed   By: Beula Brunswick M.D.   On: 09/14/2023 14:33    Procedures Procedures (including critical care time)  Medications Ordered in UC Medications  methylPREDNISolone  sodium succinate (SOLU-MEDROL ) 125 mg/2 mL injection 60 mg (60 mg Intramuscular Given 09/14/23 1436)  ipratropium-albuterol  (DUONEB) 0.5-2.5 (3) MG/3ML nebulizer solution 3 mL (3 mLs Nebulization Given 09/14/23 1438)    Initial Impression / Assessment and Plan / UC Course  I have reviewed the triage vital signs and the  nursing notes.  Pertinent labs & imaging results that were available during my care of the patient were reviewed by me and considered in my medical decision making (see chart for details).     Patient is well-appearing, afebrile, nontoxic, nontachycardic.  No indication for viral testing as she has been symptomatic for several weeks and this would not change our management.  Chest x-ray was obtained that showed no acute cardiopulmonary disease.  She was given Solu-Medrol  and DuoNeb in clinic with significant improvement of symptoms.  Random glucose was obtained and was appropriate at 107.  We did discuss that she may develop hyperglycemia related to steroid use and so was encouraged to push fluids and avoid carbohydrates.  Will start prednisone  taper tomorrow (09/15/2023) we discussed that she is not to take NSAIDs with this medication due to risk of GI bleeding but can use Tylenol, Mucinex, Flonase  over-the-counter to manage her symptoms.  She was provided a refill of Symbicort which she has previously been prescribed with instruction to rinse her mouth following use of this medication to prevent thrush.  Will also cover for acute bacterial infection given her prolonged and recent double worsening of symptoms with Augmentin .  If her symptoms are not improving within a week she is to return for reevaluation.  We discussed that if anything worsens or changes she needs to be seen immediately.  Strict return precautions given.  All questions were answered to patient satisfaction.  Final Clinical Impressions(s) / UC Diagnoses   Final diagnoses:  Acute cough  Sinobronchitis  Moderate persistent asthma with acute exacerbation     Discharge Instructions      Your x-ray was  normal with no evidence of pneumonia.  We are treating you for a sinus/bronchitis infection.  Take Augmentin  twice daily for 7 days with food as it can upset your stomach.  I am also concerned that your asthma is flared.  Continue  Symbicort and I have sent a refill to your pharmacy.  Rinse your mouth after using this medication to prevent thrush.  Start prednisone  tomorrow (09/15/2023).  Do not take NSAIDs with this medication including aspirin, ibuprofen /Advil , naproxen /Aleve .  Use Tessalon  for cough.  Continue over-the-counter medications including Mucinex and Flonase .  If you are not feeling better within a week please return for reevaluation.  If anything worsens and you have worsening cough, high fever, chest pain, shortness of breath, nausea/vomiting interfering with oral intake you need to be seen immediately.  Su radiografa fue normal sin evidencia de neumona. Lo estamos tratando por una infeccin de senos paranasales/bronquitis. Tome Augmentin  [employer] al da durante 7 das con alimentos, ya que puede causar Programme researcher, broadcasting/film/video. Tambin me preocupa que su asma se haya agravado. Contine con Symbicort y he enviado una recarga a su farmacia. Enjuguese la boca despus de usar este medicamento para prevenir la candidiasis. Comience con prednisona maana (29/08/2023). No tome AINE con este medicamento, incluyendo aspirina, ibuprofeno/Advil , naproxeno/Aleve . Use Tessalon  para la tos. Contine con los medicamentos de venta libre, incluyendo Mucinex y Flonase . Si no se siente mejor en una semana, regrese para una reevaluacin. Si algo empeora y presenta tos que empeora, fiebre alta, dolor en el pecho, dificultad para respirar, nuseas/vmitos que interfieren con la ingesta oral, necesita ser examinado de inmediato.     ED Prescriptions     Medication Sig Dispense Auth. Provider   predniSONE  (DELTASONE ) 20 MG tablet Take 2 tablets (40 mg total) by mouth daily for 4 days. 8 tablet Mikela Senn K, PA-C   amoxicillin -clavulanate (AUGMENTIN ) 875-125 MG tablet Take 1 tablet by mouth every 12 (twelve) hours. 14 tablet Ladarrion Telfair K, PA-C   budesonide-formoterol (SYMBICORT) 160-4.5 MCG/ACT inhaler Inhale 2 puffs into the lungs in the  morning and at bedtime. 1 each Devell Parkerson K, PA-C   benzonatate  (TESSALON ) 100 MG capsule Take 1 capsule (100 mg total) by mouth every 8 (eight) hours. 21 capsule Jeshawn Melucci K, PA-C      PDMP not reviewed this encounter.   Budd Cargo, PA-C 09/14/23 1456

## 2023-09-14 NOTE — Discharge Instructions (Signed)
 Your x-ray was normal with no evidence of pneumonia.  We are treating you for a sinus/bronchitis infection.  Take Augmentin  twice daily for 7 days with food as it can upset your stomach.  I am also concerned that your asthma is flared.  Continue Symbicort and I have sent a refill to your pharmacy.  Rinse your mouth after using this medication to prevent thrush.  Start prednisone  tomorrow (09/15/2023).  Do not take NSAIDs with this medication including aspirin, ibuprofen /Advil , naproxen /Aleve .  Use Tessalon  for cough.  Continue over-the-counter medications including Mucinex and Flonase .  If you are not feeling better within a week please return for reevaluation.  If anything worsens and you have worsening cough, high fever, chest pain, shortness of breath, nausea/vomiting interfering with oral intake you need to be seen immediately.  Su radiografa fue normal sin evidencia de neumona. Lo estamos tratando por una infeccin de senos paranasales/bronquitis. Tome Augmentin  dos veces al da durante 7 das con alimentos, ya que puede causar Programme researcher, broadcasting/film/video. Tambin me preocupa que su asma se haya agravado. Contine con Symbicort y he enviado una recarga a su farmacia. Enjuguese la boca despus de usar este medicamento para prevenir la candidiasis. Comience con prednisona maana (29/08/2023). No tome AINE con este medicamento, incluyendo aspirina, ibuprofeno/Advil , naproxeno/Aleve . Use Tessalon  para la tos. Contine con los medicamentos de venta libre, incluyendo Mucinex y Flonase . Si no se siente mejor en una semana, regrese para una reevaluacin. Si algo empeora y presenta tos que empeora, fiebre alta, dolor en el pecho, dificultad para respirar, nuseas/vmitos que interfieren con la ingesta oral, necesita ser examinado de inmediato.

## 2023-09-14 NOTE — ED Triage Notes (Signed)
 Cough and nasal congestion for 3 weeks.  Patient has taken mucinexpatient has had headache, forehead pressure, reports green congestion from sinus.  Patient also is using neo synephrine and albuterol  inhaler-but nothing is working.  Chest sore with coughing.  Frequent coughing during intake

## 2024-03-31 ENCOUNTER — Ambulatory Visit: Payer: Self-pay

## 2024-03-31 NOTE — Telephone Encounter (Signed)
 FYI Only or Action Required?: FYI only for provider: ED advised.  Patient was last seen in primary care on 10/31/2022 by [name], NP.  Called Nurse Triage reporting Head Injury.  Symptoms began several days ago.  Interventions attempted: Prescription medications: aspirin.  Symptoms are: gradually worsening.  Triage Disposition: Go to ED Now (Notify PCP)  Patient/caregiver understands and will follow disposition?: Yes   Copied from CRM #[id number]. Topic: Clinical - Red Word Triage >> Mar 31, 2024  2:02 PM Tiffany Vazquez wrote: Red Word that prompted transfer to Nurse Triage: Patient had a fall on Monday and hit her head. Patient states since the fall she feels like she cannot see as well that her vision has been affected and has been having pain in her head. Reason for Disposition  [1] Loss of vision or double vision AND [2] present now  Answer Assessment - Initial Assessment Questions 1. MECHANISM: How did the injury happen? For falls, ask: What height did you fall from? and What surface did you fall against?      Fell and hit head on stair step 2. ONSET: When did the injury happen? (e.g., minutes, hours ago)      Three days ago 3. NEUROLOGIC SYMPTOMS: Was there any loss of consciousness? Are there any other neurological symptoms?      Blurred vision 4. MENTAL STATUS: Does the person know who they are, who you are, and where they are?      Alert and oriented x 3 5. LOCATION: What part of the head was hit?      forehead 6. SCALP APPEARANCE: What does the scalp look like? Is it bleeding now? If Yes, ask: Is it difficult to stop?      Bump on forehead 7. SIZE: For cuts, bruises, or swelling, ask: How large is it? (e.g., inches or centimeters)      Some redness to forehead 8. PAIN: Is there any pain? If Yes, ask: How bad is it? (Scale 0-10; or none, mild, moderate, severe)     Only pain with palpation, but does have new head ache today 9. TETANUS: For  any breaks in the skin, ask: When was your last tetanus booster?     N/a 10. BLOOD THINNERS: Do you take any blood thinners? (e.g., aspirin, clopidogrel / Plavix, coumadin, heparin). Notes: Other strong blood thinners include: Arixtra (fondaparinux), Eliquis (apixaban), Pradaxa (dabigatran), and Xarelto (rivaroxaban).       denies 11. OTHER SYMPTOMS: Do you have any other symptoms? (e.g., neck pain, vomiting)       denies 12. PREGNANCY: Is there any chance you are pregnant? When was your last menstrual period?       N/a  Protocols used: Head Injury-A-AH

## 2024-03-31 NOTE — Telephone Encounter (Signed)
 Noted

## 2024-04-01 ENCOUNTER — Ambulatory Visit (HOSPITAL_COMMUNITY)
Admission: EM | Admit: 2024-04-01 | Discharge: 2024-04-01 | Disposition: A | Discharge status: Home / Self Care | Payer: Self-pay | Attending: Physician Assistant | Admitting: Physician Assistant

## 2024-04-01 ENCOUNTER — Other Ambulatory Visit: Payer: Self-pay

## 2024-04-01 ENCOUNTER — Encounter (HOSPITAL_COMMUNITY): Payer: Self-pay

## 2024-04-01 DIAGNOSIS — W19XXXA Unspecified fall, initial encounter: Secondary | ICD-10-CM

## 2024-04-01 DIAGNOSIS — G44309 Post-traumatic headache, unspecified, not intractable: Secondary | ICD-10-CM

## 2024-04-01 DIAGNOSIS — S50312A Abrasion of left elbow, initial encounter: Secondary | ICD-10-CM

## 2024-04-01 MED ORDER — MUPIROCIN 2 % EX OINT
1.0000 | TOPICAL_OINTMENT | Freq: Two times a day (BID) | CUTANEOUS | 0 refills | Status: AC
  Filled 2024-04-01: qty 22, 11d supply, fill #0

## 2024-04-01 NOTE — ED Provider Notes (Signed)
 [facility]    CSN: [id number] Arrival date & time: 04/01/24  1306      History   Chief Complaint Chief Complaint  Patient presents with   Fall    HPI Tiffany Vazquez Vazquez is a 53 y.o. female.   Patient presents today with a several day history of headache and vision change.  She Spanish-speaking and video interpreter was utilized for entire visit.  She reports that Monday (03/28/2024) she tripped and fell hitting her head on a concrete step.  She had no loss of consciousness and has not had any nausea or vomiting.  She does not take blood thinning medication.  She has had an ongoing frontal headache with associated hematoma and also reports a vision change.  She reports that her headache and vision change have improved but continued prompting evaluation.  She reports that currently her headache pain is rated 5 on a 0-10 pain scale, described as throbbing, no alleviating factors notified.  She has tried aspirin and diclofenac  with temper improvement of pain.  She denies previous concussion.  She is confident that she is not pregnant.  She is having difficulty with her daily activities because of the headache pain.  This is not the worst headache of her life.  She did sustain an abrasion to her left elbow.  She believes that she is up-to-date on her tetanus which was last given 3 years ago; no record of this in the EMR but patient discomfort and has been within the past 5 years.    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 Authorizing Provider  mupirocin ointment (BACTROBAN) 2 % Apply 1 Application topically 2 (two) times daily. 04/01/24  Yes Zareena Willis K, PA-C  albuterol  (PROVENTIL  HFA) 108 (90 Base) MCG/ACT inhaler Inhale 2 puffs into the lungs every 6 (six) hours as needed for wheezing or shortness of breath. 09/25/22   Newlin, Enobong, MD  budesonide -formoterol  (SYMBICORT ) 160-4.5 MCG/ACT inhaler Inhale 2 puffs  into the lungs in the morning and at bedtime. 09/14/23   Oakleigh Hesketh, Rocky POUR, PA-C  loratadine  (CLARITIN ) 10 MG tablet Take 1 tablet (10 mg total) by mouth daily. 03/21/22   Newlin, Enobong, MD  cetirizine  (ZYRTEC ) 10 MG tablet Take 1 tablet (10 mg total) by mouth daily. One tab daily for allergies 09/27/11 06/11/12  Vincente Lynwood BIRCH, MD    Family History Family History  Problem Relation Age of Onset   Diabetes Mother    Allergic Disorder Mother    Diabetes Sister    Diabetes Sister    Diabetes Sister    Diabetes Sister    Diabetes Sister    Diabetes Brother    Diabetes Brother    Breast cancer Neg Hx     Social History Social History   Tobacco Use   Smoking status: Never   Smokeless tobacco: Never  Vaping Use   Vaping status: Never Used  Substance Use Topics   Alcohol use: Yes    Comment:  rarely   Drug use: No     Allergies   Other   Review of Systems Review of Systems  Constitutional:  Positive for activity change. Negative for appetite change, fatigue and fever.  Eyes:  Positive for visual disturbance. Negative for photophobia.  Respiratory:  Negative for shortness of breath.   Cardiovascular:  Negative for chest pain.  Gastrointestinal:  Negative for abdominal pain, diarrhea, nausea and vomiting.  Musculoskeletal:  Negative for arthralgias and myalgias.  Skin:  Positive for wound (Abrasion on left elbow).  Neurological:  Positive for headaches. Negative for dizziness, seizures, syncope, speech difficulty, weakness, light-headedness and numbness.     Physical Exam Triage Vital Signs ED Triage Vitals  Encounter Vitals Group     BP 04/01/24 1354 136/84     Girls Systolic BP Percentile --      Girls Diastolic BP Percentile --      Boys Systolic BP Percentile --      Boys Diastolic BP Percentile --      Pulse Rate 04/01/24 1354 72     Resp 04/01/24 1354 16     Temp 04/01/24 1354 97.7 F (36.5 C)     Temp Source 04/01/24 1354 Oral     SpO2 04/01/24 1354 98 %     Weight --      Height --      Head Circumference --      Peak Flow --      Pain Score 04/01/24 1353 5     Pain Loc --      Pain Education --      Exclude from Growth Chart --    No data found.  Updated Vital Signs BP 136/84 (BP Location: Left Arm)   Pulse 72   Temp 97.7 F (36.5 C) (Oral)   Resp 16   LMP 08/06/2022 (Approximate)   SpO2 98%   Visual Acuity Right Eye Distance: 20/40 Left Eye Distance: 20/30 Bilateral Distance: 20/30  Right Eye Near:   Left Eye Near:    Bilateral Near:     Physical Exam Vitals reviewed.  Constitutional:      General: She is awake. She is not in acute distress.    Appearance: Normal appearance. She is well-developed. She is not ill-appearing.     Comments: Very pleasant female appears stated age in no acute distress sitting comfortably in exam  room  HENT:     Head: Normocephalic and atraumatic. No raccoon eyes, Battle's sign  or contusion.     Right Ear: Tympanic membrane, ear canal and external ear normal. No hemotympanum.     Left Ear: Tympanic membrane, ear canal and external ear normal. No hemotympanum.     Nose: Nose normal.     Mouth/Throat:     Tongue: Tongue does not deviate from midline.     Pharynx: Uvula midline. No oropharyngeal exudate or posterior oropharyngeal erythema.  Eyes:     Extraocular Movements: Extraocular movements intact.     Conjunctiva/sclera: Conjunctivae normal.     Pupils: Pupils are equal, round, and reactive to light.  Cardiovascular:     Rate and Rhythm: Normal rate and regular rhythm.     Heart sounds: Normal heart sounds, S1 normal and S2 normal. No murmur heard. Pulmonary:     Effort: Pulmonary effort is normal.     Breath sounds: Normal breath sounds. No wheezing, rhonchi or rales.     Comments: Clear to auscultation bilaterally Abdominal:     Palpations: Abdomen is soft.     Tenderness: There is no abdominal tenderness.  Musculoskeletal:     Comments: Strength 5/5 bilateral upper and lower extremities.  Lymphadenopathy:     Head:     Right side of head: No submental, submandibular or tonsillar adenopathy.     Left side of head: No submental, submandibular or tonsillar adenopathy.  Skin:    Findings: Abrasion present.     Comments: Approximately 3 cm x 2 cm abrasion noted left posterior elbow.  Neurological:     General: No focal deficit present.     Mental Status: She is alert and oriented to person, place, and time.     Cranial Nerves: Cranial nerves 2-12 are intact.     Motor: Motor function is intact.     Coordination: Coordination is intact.     Gait: Gait is intact.     Comments: Cranial nerves II through XII grossly intact.  No focal neurological defect on exam.  Psychiatric:        Behavior: Behavior is cooperative.      UC Treatments / Results  Labs (all labs  ordered are listed, but only abnormal results are displayed) Labs Reviewed - No data to display  EKG   Radiology No results found.  Procedures Procedures (including critical care time)  Medications Ordered in UC Medications - No data to display  Initial Impression / Assessment and Plan / UC Course  I have reviewed the triage vital signs and the nursing notes.  Pertinent labs & imaging results that were available during my care of the patient were reviewed by me and considered in my medical decision making (see chart for details).     Patient is well-appearing, afebrile, nontoxic, nontachycardic.  She had a normal neurologic exam but we discussed that given her recent head trauma with associated vision changes a visiting reduced go to the emergency room.  Patient reported that overall her headache has improved and is not severe and so she was unsure if this was necessary.  We discussed that since it has been 4 days since the onset of injury and she is not having any severe symptoms it is reasonable to follow-up outpatient and so she was given the contact information for a neurologist with instruction to call to schedule appointment first thing Monday morning.  Recommended she use Tylenol over NSAIDs to help manage her pain and she is to follow physical and mental rest.  We discussed that if at any  point she has any changing symptoms including slightly worsening headache, severe headache, nausea/vomiting, lethargy, visual change, weakness she needs to go immediately to the ER.  She does not live alone and will have her family members monitor her closely for any worsening symptoms.  All questions were answered to patient satisfaction she expressed understanding and agreement the treatment plan.  Video interpreter was utilized for the entirety of visit.  Final Clinical Impressions(s) / UC Diagnoses   Final diagnoses:  Post-concussion headache  Fall, initial encounter  Abrasion of left elbow,  initial encounter     Discharge Instructions      As we discussed, the safe thing to do is to go to the emergency room.  Since you are feeling better I think is reasonable to follow-up with your neurologist.  Call to schedule an appointment first thing Monday morning.  Avoid any strenuous physical or mental activity.  Take Tylenol 650 mg up to 4 times a day to help with your pain.  Avoid NSAIDs including aspirin, ibuprofen /Advil , naproxen /Aleve .  Keep the abrasion on your elbow clean and apply Bactroban ointment twice daily with dressing changes.  As we discussed, if anything changes and you have severe headache, worsening headache in any way, nausea/vomiting, vision change, weakness, confusion, difficulty staying awake you need to go to the emergency room immediately.  Como ya hablamos, lo ms seguro es ir a oceanographer. Dado que se siente mejor, creo que es conveniente que consulte con su neurlogo. Llame para programar una cita el lunes a primera hora. Evite cualquier actividad fsica o mental intensa. Tome Tylenol 650 mg hasta 4 veces al da para engineer, materials. Evite los AINE, incluyendo aspirina, ibuprofeno/Advil  y naproxeno/Aleve . Mantenga limpia la abrasin en el codo y aplique pomada Bactroban dos veces al da con cada cambio de vendaje. Como ya comentamos, si nota algn cambio y presenta dolor de cabeza intenso, un empeoramiento del dolor de cabeza, nuseas o vmitos, cambios en la visin, debilidad, confusin o dificultad para mantenerse despierto, debe ir a urgencias de inmediato.     ED Prescriptions     Medication Sig Dispense Auth. Provider   mupirocin ointment (BACTROBAN) 2 % Apply 1 Application topically 2 (two) times daily. 22 g Truth Barot K, PA-C      PDMP not reviewed this encounter.   Sherrell Rocky POUR, PA-C 04/01/24 1502

## 2024-04-01 NOTE — Discharge Instructions (Addendum)
 As we discussed, the safe thing to do is to go to the emergency room.  Since you are feeling better I think is reasonable to follow-up with your neurologist.  Call to schedule an appointment first thing Monday morning.  Avoid any strenuous physical or mental activity.  Take Tylenol 650 mg up to 4 times a day to help with your pain.  Avoid NSAIDs including aspirin, ibuprofen /Advil , naproxen /Aleve .  Keep the abrasion on your elbow clean and apply Bactroban ointment twice daily with dressing changes.  As we discussed, if anything changes and you have severe headache, worsening headache in any way, nausea/vomiting, vision change, weakness, confusion, difficulty staying awake you need to go to the emergency room immediately.  Como ya hablamos, lo ms seguro es ir a oceanographer. Dado que se siente mejor, creo que es conveniente que consulte con su neurlogo. Llame para programar una cita el lunes a primera hora. Evite cualquier actividad fsica o mental intensa. Tome Tylenol 650 mg hasta 4 veces al da para engineer, materials. Evite los AINE, incluyendo aspirina, ibuprofeno/Advil  y naproxeno/Aleve . Mantenga limpia la abrasin en el codo y aplique [employer] veces al da con cada cambio de vendaje. Como ya comentamos, si nota algn cambio y presenta dolor de cabeza intenso, un empeoramiento del dolor de cabeza, nuseas o vmitos, cambios en la visin, debilidad, confusin o dificultad para mantenerse despierto, debe ir a urgencias de inmediato.

## 2024-04-01 NOTE — ED Triage Notes (Signed)
 Patient here today with c/o headache and forehead tenderness and swelling after falling on Monday. Patient states that she did have some visual changes. Patient states that she has taken ASA and Diclofenac  with some relief. Patient states that she had worsening pain so that's why she came in today. The pain has been coming and going.
